# Patient Record
Sex: Female | Born: 1945 | Race: White | Hispanic: No | Smoking: Never smoker
Health system: Southern US, Community
[De-identification: ages and names within clinical notes are randomized; demographics above are authoritative.]

## PROBLEM LIST (undated history)

## (undated) DIAGNOSIS — I10 Essential (primary) hypertension: Secondary | ICD-10-CM

## (undated) DIAGNOSIS — E785 Hyperlipidemia, unspecified: Secondary | ICD-10-CM

## (undated) DIAGNOSIS — R011 Cardiac murmur, unspecified: Secondary | ICD-10-CM

## (undated) HISTORY — PX: OTHER SURGICAL HISTORY: SHX169

---

## 2001-07-30 ENCOUNTER — Emergency Department (HOSPITAL_COMMUNITY): Admission: EM | Admit: 2001-07-30 | Discharge: 2001-07-30 | Payer: Self-pay | Admitting: Emergency Medicine

## 2003-04-30 ENCOUNTER — Ambulatory Visit (HOSPITAL_COMMUNITY): Admission: RE | Admit: 2003-04-30 | Discharge: 2003-04-30 | Payer: Self-pay | Admitting: Internal Medicine

## 2012-07-07 HISTORY — PX: MELANOMA EXCISION: SHX5266

## 2013-03-31 ENCOUNTER — Other Ambulatory Visit (INDEPENDENT_AMBULATORY_CARE_PROVIDER_SITE_OTHER): Payer: Self-pay | Admitting: *Deleted

## 2013-03-31 ENCOUNTER — Telehealth (INDEPENDENT_AMBULATORY_CARE_PROVIDER_SITE_OTHER): Payer: Self-pay | Admitting: *Deleted

## 2013-03-31 DIAGNOSIS — Z1211 Encounter for screening for malignant neoplasm of colon: Secondary | ICD-10-CM

## 2013-03-31 MED ORDER — PEG-KCL-NACL-NASULF-NA ASC-C 100 G PO SOLR: 1.0000 | Freq: Once | ORAL | Status: DC

## 2013-03-31 NOTE — Telephone Encounter (Signed)
Patient needs movi prep 

## 2013-04-26 ENCOUNTER — Encounter (HOSPITAL_COMMUNITY): Payer: Self-pay | Admitting: Pharmacy Technician

## 2013-05-03 ENCOUNTER — Telehealth (INDEPENDENT_AMBULATORY_CARE_PROVIDER_SITE_OTHER): Payer: Self-pay | Admitting: *Deleted

## 2013-05-03 NOTE — Telephone Encounter (Signed)
agree

## 2013-05-03 NOTE — Telephone Encounter (Signed)
  Procedure: tcs  Reason/Indication:  screening  Has patient had this procedure before?  Yes, 10 yrs ago  If so, when, by whom and where?    Is there a family history of colon cancer?  no  Who?  What age when diagnosed?    Is patient diabetic?   no      Does patient have prosthetic heart valve?  no  Do you have a pacemaker?  no  Has patient ever had endocarditis? no  Has patient had joint replacement within last 12 months?  no  Does patient tend to be constipated or take laxatives? no  Is patient on Coumadin, Plavix and/or Aspirin? yes  Medications: asa 81 mg daily, stool softener bid, losartan 50 mg daily, crestor 40 mg daily, spironolactone 25 mg tid, calcium 600 plus d bid, medroxyprogesterone 25 mg every 3 months  Allergies: nkda  Medication Adjustment: asa 2 days  Procedure date & time: 05/25/13 at 1030

## 2013-05-25 ENCOUNTER — Ambulatory Visit (HOSPITAL_COMMUNITY)
Admission: RE | Admit: 2013-05-25 | Discharge: 2013-05-25 | Disposition: A | Discharge status: Home / Self Care | Payer: Medicare Other | Source: Ambulatory Visit | Attending: Internal Medicine | Admitting: Internal Medicine

## 2013-05-25 ENCOUNTER — Encounter (HOSPITAL_COMMUNITY)
Admission: RE | Disposition: A | Discharge status: Home / Self Care | Payer: Self-pay | Source: Ambulatory Visit | Attending: Internal Medicine

## 2013-05-25 ENCOUNTER — Encounter (HOSPITAL_COMMUNITY): Payer: Self-pay

## 2013-05-25 DIAGNOSIS — Z79899 Other long term (current) drug therapy: Secondary | ICD-10-CM | POA: Insufficient documentation

## 2013-05-25 DIAGNOSIS — K644 Residual hemorrhoidal skin tags: Secondary | ICD-10-CM | POA: Insufficient documentation

## 2013-05-25 DIAGNOSIS — Z1211 Encounter for screening for malignant neoplasm of colon: Secondary | ICD-10-CM

## 2013-05-25 DIAGNOSIS — Z6838 Body mass index (BMI) 38.0-38.9, adult: Secondary | ICD-10-CM | POA: Insufficient documentation

## 2013-05-25 DIAGNOSIS — I10 Essential (primary) hypertension: Secondary | ICD-10-CM | POA: Insufficient documentation

## 2013-05-25 DIAGNOSIS — D126 Benign neoplasm of colon, unspecified: Secondary | ICD-10-CM

## 2013-05-25 DIAGNOSIS — K5730 Diverticulosis of large intestine without perforation or abscess without bleeding: Secondary | ICD-10-CM

## 2013-05-25 DIAGNOSIS — Z7982 Long term (current) use of aspirin: Secondary | ICD-10-CM | POA: Insufficient documentation

## 2013-05-25 HISTORY — DX: Hyperlipidemia, unspecified: E78.5

## 2013-05-25 HISTORY — PX: COLONOSCOPY: SHX5424

## 2013-05-25 HISTORY — DX: Essential (primary) hypertension: I10

## 2013-05-25 HISTORY — DX: Cardiac murmur, unspecified: R01.1

## 2013-05-25 SURGERY — COLONOSCOPY
Anesthesia: Moderate Sedation

## 2013-05-25 MED ORDER — STERILE WATER FOR IRRIGATION IR SOLN
Status: DC | PRN
  Administered 2013-05-25: 10:00:00

## 2013-05-25 MED ORDER — MIDAZOLAM HCL 5 MG/5ML IJ SOLN
INTRAMUSCULAR | Status: AC
  Filled 2013-05-25: qty 10

## 2013-05-25 MED ORDER — SODIUM CHLORIDE 0.9 % IV SOLN
INTRAVENOUS | Status: DC
  Administered 2013-05-25: 10:00:00 via INTRAVENOUS

## 2013-05-25 MED ORDER — MIDAZOLAM HCL 5 MG/5ML IJ SOLN
INTRAMUSCULAR | Status: DC | PRN
  Administered 2013-05-25: 2 mg via INTRAVENOUS
  Administered 2013-05-25: 1 mg via INTRAVENOUS
  Administered 2013-05-25 (×2): 2 mg via INTRAVENOUS

## 2013-05-25 MED ORDER — MEPERIDINE HCL 50 MG/ML IJ SOLN
INTRAMUSCULAR | Status: DC | PRN
  Administered 2013-05-25 (×2): 25 mg via INTRAVENOUS

## 2013-05-25 MED ORDER — MEPERIDINE HCL 50 MG/ML IJ SOLN
INTRAMUSCULAR | Status: AC
  Filled 2013-05-25: qty 1

## 2013-05-25 NOTE — H&P (Signed)
Jennifer Cochran is an 68 y.o. female.   Chief Complaint: Patient is here for screening colonoscopy. HPI: Patient is a 67 year old Caucasian female who presents for screening colonoscopy. She denies abdominal pain change in bowel habits or rectal bleeding. Last colonoscopy was about 10 years ago.  Family history is negative for CRC.  Past Medical History  Diagnosis Date  . Hypertension     Past Surgical History  Procedure Laterality Date  . Melanoma excision  may 2014    [place] Dr. [place] Cochran  . Tooth removal  late 70's    History reviewed. No pertinent family history. Social History:  reports that she has never smoked. She does not have any smokeless tobacco history on file. She reports that she does not drink alcohol or use illicit drugs.  Allergies: No Known Allergies  Medications Prior to Admission  Medication Sig Dispense Refill  . aspirin (ECOTRIN LOW STRENGTH) 81 MG EC tablet Take 81 mg by mouth daily. Swallow whole.      . Calcium Carb-Cholecalciferol (CALCIUM 600 + D PO) Take 1 tablet by mouth 2 (two) times daily.      Marland Kitchen docusate sodium (COLACE) 100 MG capsule Take 100 mg by mouth 2 (two) times daily.      . Eflornithine HCl (VANIQA) 13.9 % cream Apply 1 application topically 2 (two) times daily with a meal.      . losartan (COZAAR) 50 MG tablet Take 50 mg by mouth daily.      . medroxyPROGESTERone (PROVERA) 2.5 MG tablet Take 2.5 mg by mouth as directed. TAKES ONE TABLET FOR 7 DAYS EVERY 3 MONTHS      . peg 3350 powder (MOVIPREP) 100 G SOLR Take 1 kit (200 g total) by mouth once.  1 kit  0  . rosuvastatin (CRESTOR) 40 MG tablet Take 40 mg by mouth daily.      Marland Kitchen spironolactone (ALDACTONE) 25 MG tablet Take 25 mg by mouth 3 (three) times daily.        No results found for this or any previous visit (from the past 48 hour(s)). No results found.  ROS  Blood pressure 167/71, pulse 94, temperature 98.5 F (36.9 C), temperature source Oral, resp. rate 21, height 5' 7.5"  (1.715 m), weight 250 lb (113.399 kg), SpO2 98.00%. Physical Exam  Constitutional: She appears well-developed and well-nourished.  HENT:  Mouth/Throat: Oropharynx is clear and moist.  Eyes: Conjunctivae are normal. No scleral icterus.  Neck: No thyromegaly present.  Cardiovascular: Normal rate and regular rhythm.   Murmur (grade 2/6 systolic ejection murmur best heard LLSB and LUSB.) heard. Respiratory: Effort normal and breath sounds normal.  GI: Soft. She exhibits no distension and no mass. There is no tenderness.  Musculoskeletal: She exhibits no edema.  Lymphadenopathy:    She has no cervical adenopathy.  Neurological: She is alert.  Skin: Skin is warm and dry.     Assessment/Plan Average risk screening colonoscopy.  Jennifer Cochran U 05/25/2013, 10:32 AM

## 2013-05-25 NOTE — Op Note (Signed)
COLONOSCOPY PROCEDURE REPORT  PATIENT:  Jennifer Cochran  MR#:  [id number] Birthdate:  11/01/1945, 68 y.o., female Endoscopist:  Dr. Rogene Houston, MD Referred By:  Dr. Starr Sinclair. Everette Rank, MD  Procedure Date: 05/25/2013  Procedure:   Colonoscopy  Indications:  Patient is a 68 year old Caucasian female was undergoing average risk screening colonoscopy.  Informed Consent:  The procedure and risks were reviewed with the patient and informed consent was obtained.  Medications:  Demerol 50 mg IV Versed 7 mg IV  Description of procedure:  After a digital rectal exam was performed, that colonoscope was advanced from the anus through the rectum and colon to the area of the cecum, ileocecal valve and appendiceal orifice. The cecum was deeply intubated. These structures were well-seen and photographed for the record. From the level of the cecum and ileocecal valve, the scope was slowly and cautiously withdrawn. The mucosal surfaces were carefully surveyed utilizing scope tip to flexion to facilitate fold flattening as needed. The scope was pulled down into the rectum where a thorough exam including retroflexion was performed.  Findings:   Prep excellent. Small polyp ablated via cold biopsy from ascending colon. Few small scattered diverticula at sigmoid colon. Normal rectal mucosa. Small hemorrhoids below the dentate line.   Therapeutic/Diagnostic Maneuvers Performed:  See above  Complications:  None  Cecal Withdrawal Time:  10 minutes  Impression:  Examination performed to cecum. Small polyp ablated via cold biopsy from ascending colon. Mild sigmoid colon diverticulosis. Small external hemorrhoids.  Recommendations:  Standard instructions given. I will contact patient with biopsy results and further recommendations.  Raidon Swanner U  05/25/2013 11:13 AM  CC: Dr. Lanette Hampshire, MD & Dr. Rayne Du ref. provider found

## 2013-05-25 NOTE — Discharge Instructions (Signed)
Resume usual medications and high fiber diet. No driving for 24 hours. Physician will call with biopsy results.  Colonoscopy, Care After Refer to this sheet in the next few weeks. These instructions provide you with information on caring for yourself after your procedure. Your health care provider may also give you more specific instructions. Your treatment has been planned according to current medical practices, but problems sometimes occur. Call your health care provider if you have any problems or questions after your procedure. WHAT TO EXPECT AFTER THE PROCEDURE  After your procedure, it is typical to have the following:  A small amount of blood in your stool.  Moderate amounts of gas and mild abdominal cramping or bloating. HOME CARE INSTRUCTIONS  Do not drive, operate machinery, or sign important documents for 24 hours.  You may shower and resume your regular physical activities, but move at a slower pace for the first 24 hours.  Take frequent rest periods for the first 24 hours.  Walk around or put a warm pack on your abdomen to help reduce abdominal cramping and bloating.  Drink enough fluids to keep your urine clear or pale yellow.  You may resume your normal diet as instructed by your health care provider. Avoid heavy or fried foods that are hard to digest.  Avoid drinking alcohol for 24 hours or as instructed by your health care provider.  Only take over-the-counter or prescription medicines as directed by your health care provider.  If a tissue sample (biopsy) was taken during your procedure:  Do not take aspirin or blood thinners for 7 days, or as instructed by your health care provider.  Do not drink alcohol for 7 days, or as instructed by your health care provider.  Eat soft foods for the first 24 hours. SEEK MEDICAL CARE IF: You have persistent spotting of blood in your stool 2 3 days after the procedure. SEEK IMMEDIATE MEDICAL CARE IF:  You have more than a  small spotting of blood in your stool.  You pass large blood clots in your stool.  Your abdomen is swollen (distended).  You have nausea or vomiting.  You have a fever.  You have increasing abdominal pain that is not relieved with medicine. Document Released: 10/08/2003 Document Revised: 12/14/2012 Document Reviewed: 10/31/2012 [facility] Patient Information 2014 [place].

## 2013-05-29 ENCOUNTER — Encounter (HOSPITAL_COMMUNITY): Payer: Self-pay | Admitting: Internal Medicine

## 2013-06-07 ENCOUNTER — Encounter (INDEPENDENT_AMBULATORY_CARE_PROVIDER_SITE_OTHER): Payer: Self-pay | Admitting: *Deleted

## 2013-06-30 ENCOUNTER — Ambulatory Visit (HOSPITAL_COMMUNITY)
Admission: RE | Admit: 2013-06-30 | Discharge: 2013-06-30 | Disposition: A | Discharge status: Home / Self Care | Payer: Medicare Other | Source: Ambulatory Visit | Attending: Family Medicine | Admitting: Family Medicine

## 2013-06-30 DIAGNOSIS — E785 Hyperlipidemia, unspecified: Secondary | ICD-10-CM | POA: Diagnosis not present

## 2013-06-30 DIAGNOSIS — R011 Cardiac murmur, unspecified: Secondary | ICD-10-CM | POA: Diagnosis not present

## 2013-06-30 DIAGNOSIS — I10 Essential (primary) hypertension: Secondary | ICD-10-CM | POA: Insufficient documentation

## 2013-06-30 DIAGNOSIS — I359 Nonrheumatic aortic valve disorder, unspecified: Secondary | ICD-10-CM

## 2013-06-30 NOTE — Progress Notes (Signed)
*  PRELIMINARY RESULTS* Echocardiogram 2D Echocardiogram has been performed.  Jennifer Cochran 06/30/2013, 9:13 AM

## 2014-07-23 ENCOUNTER — Ambulatory Visit (HOSPITAL_COMMUNITY)
Admission: RE | Admit: 2014-07-23 | Discharge: 2014-07-23 | Disposition: A | Discharge status: Home / Self Care | Payer: Medicare Other | Source: Ambulatory Visit | Attending: Family Medicine | Admitting: Family Medicine

## 2014-07-23 ENCOUNTER — Other Ambulatory Visit (HOSPITAL_COMMUNITY): Payer: Self-pay | Admitting: Family Medicine

## 2014-07-23 DIAGNOSIS — Z7722 Contact with and (suspected) exposure to environmental tobacco smoke (acute) (chronic): Secondary | ICD-10-CM

## 2016-05-19 ENCOUNTER — Other Ambulatory Visit (HOSPITAL_COMMUNITY): Payer: Self-pay | Admitting: Internal Medicine

## 2016-05-19 ENCOUNTER — Ambulatory Visit (HOSPITAL_COMMUNITY)
Admission: RE | Admit: 2016-05-19 | Discharge: 2016-05-19 | Disposition: A | Discharge status: Home / Self Care | Payer: Medicare Other | Source: Ambulatory Visit | Attending: Internal Medicine | Admitting: Internal Medicine

## 2016-05-19 DIAGNOSIS — M19071 Primary osteoarthritis, right ankle and foot: Secondary | ICD-10-CM | POA: Insufficient documentation

## 2016-05-19 DIAGNOSIS — M79671 Pain in right foot: Secondary | ICD-10-CM

## 2016-05-19 DIAGNOSIS — M7731 Calcaneal spur, right foot: Secondary | ICD-10-CM | POA: Insufficient documentation

## 2016-05-19 DIAGNOSIS — M25571 Pain in right ankle and joints of right foot: Secondary | ICD-10-CM

## 2016-08-27 ENCOUNTER — Ambulatory Visit (HOSPITAL_COMMUNITY)
Admission: RE | Admit: 2016-08-27 | Discharge: 2016-08-27 | Disposition: A | Discharge status: Home / Self Care | Payer: Medicare Other | Source: Ambulatory Visit | Attending: Internal Medicine | Admitting: Internal Medicine

## 2016-08-27 ENCOUNTER — Other Ambulatory Visit (HOSPITAL_COMMUNITY): Payer: Self-pay | Admitting: Internal Medicine

## 2016-08-27 DIAGNOSIS — I340 Nonrheumatic mitral (valve) insufficiency: Secondary | ICD-10-CM

## 2016-08-27 DIAGNOSIS — I351 Nonrheumatic aortic (valve) insufficiency: Secondary | ICD-10-CM | POA: Diagnosis not present

## 2016-08-27 LAB — ECHOCARDIOGRAM COMPLETE
AV Area VTI index: 0.53 cm2/m2
AV Area VTI: 1.01 cm2
AV Area mean vel: 1.07 cm2
AV Mean grad: 22 mmHg
AV Peak grad: 38 mmHg
AV peak Index: 0.43
AV pk vel: 310 cm/s
AV vel: 1.26
AVAREAMEANVIN: 0.45 cm2/m2
AVCELMEANRAT: 0.42
Ao pk vel: 0.4 m/s
CHL CUP AV VALUE AREA INDEX: 0.53
CHL CUP MV DEC (S): 264
CHL CUP STROKE VOLUME: 39 mL
CHL CUP TV REG PEAK VELOCITY: 203 cm/s
DOP CAL AO MEAN VELOCITY: 214 cm/s
E decel time: 264 msec
E/e' ratio: 13.17
FS: 35 % (ref 28–44)
IVS/LV PW RATIO, ED: 1.09
LA ID, A-P, ES: 32 mm
LA diam end sys: 32 mm
LA diam index: 1.35 cm/m2
LA vol A4C: 56.3 ml
LA vol index: 20.4 mL/m2
LA vol: 48.4 mL
LV E/e' medial: 13.17
LV E/e'average: 13.17
LV PW d: 12 mm — AB (ref 0.6–1.1)
LV SIMPSON'S DISK: 72
LV dias vol index: 23 mL/m2
LV e' LATERAL: 8.05 cm/s
LV sys vol index: 6 mL/m2
LV sys vol: 15 mL (ref 14–42)
LVDIAVOL: 55 mL (ref 46–106)
LVOT VTI: 36.9 cm
LVOT area: 2.54 cm2
LVOT peak grad rest: 6 mmHg
LVOTD: 18 mm
LVOTPV: 123 cm/s
LVOTSV: 94 mL
LVOTVTI: 0.5 cm
MV Peak grad: 4 mmHg
MVPKAVEL: 107 m/s
MVPKEVEL: 106 m/s
RV LATERAL S' VELOCITY: 14.1 cm/s
RV sys press: 19 mmHg
TAPSE: 24.4 mm
TDI e' lateral: 8.05
TDI e' medial: 5.77
TRMAXVEL: 203 cm/s
VTI: 74.2 cm
Valve area: 1.26 cm2

## 2016-08-27 NOTE — Progress Notes (Signed)
*  PRELIMINARY RESULTS* Echocardiogram 2D Echocardiogram has been performed.  Jennifer Cochran 08/27/2016, 11:39 AM

## 2017-08-28 IMAGING — DX DG FOOT COMPLETE 3+V*R*
3 series · 3 of 3 positions shown · non-contrast
Comparison: None.

CLINICAL DATA: Pain following twisting injury

EXAM:
RIGHT FOOT COMPLETE - 3+ VIEW

[foot ap]
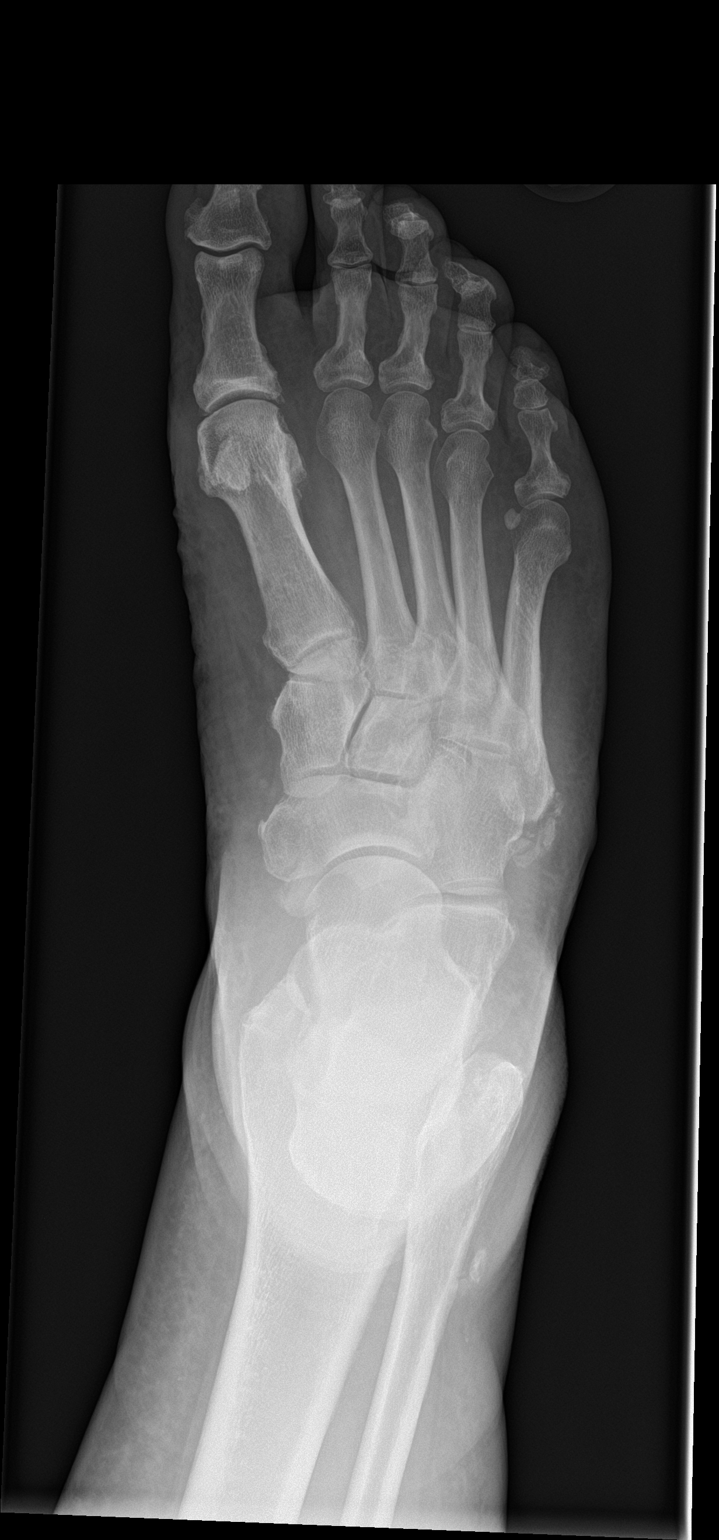

[foot obl]
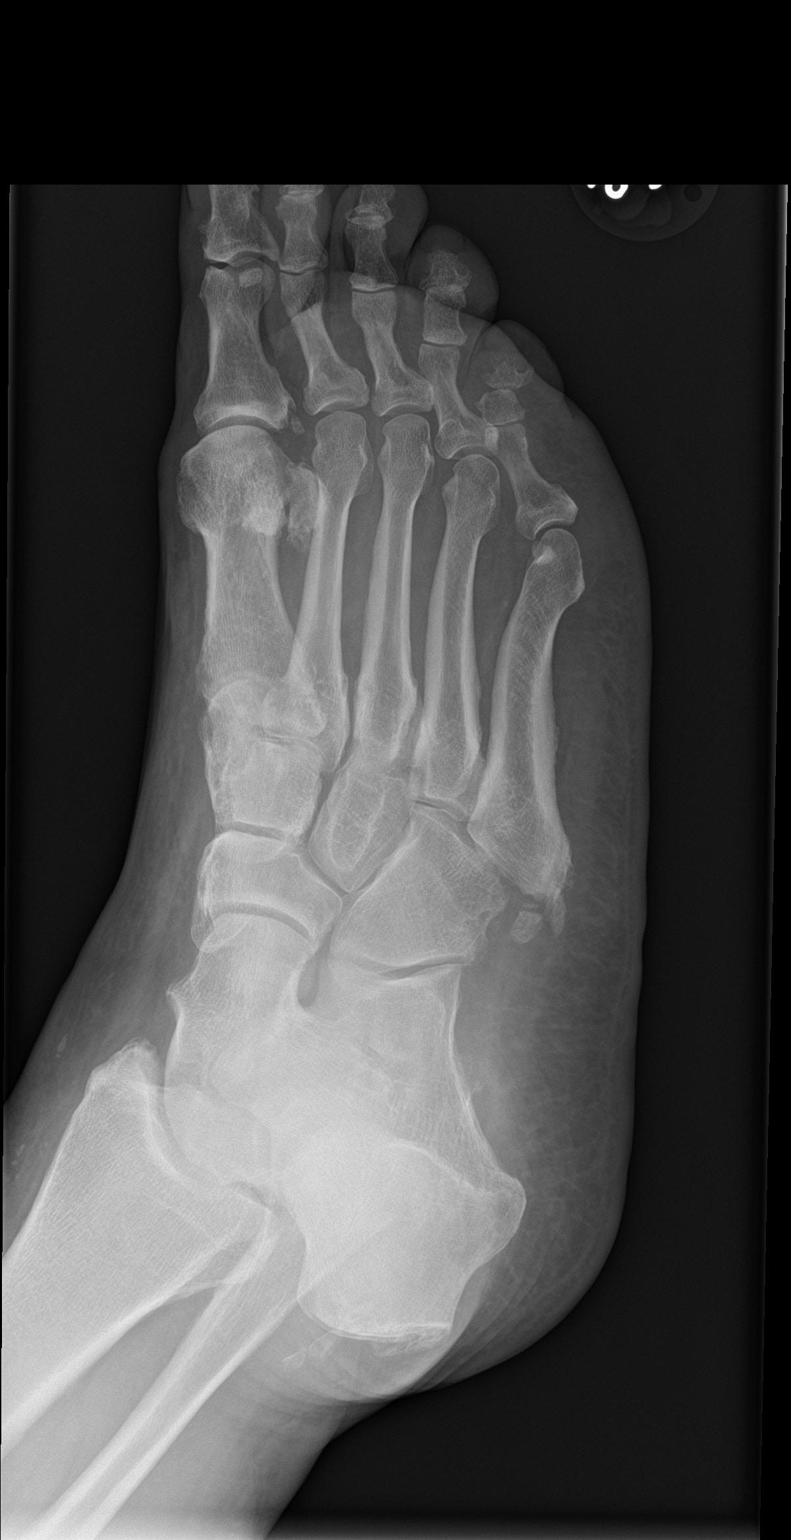

[foot lat]
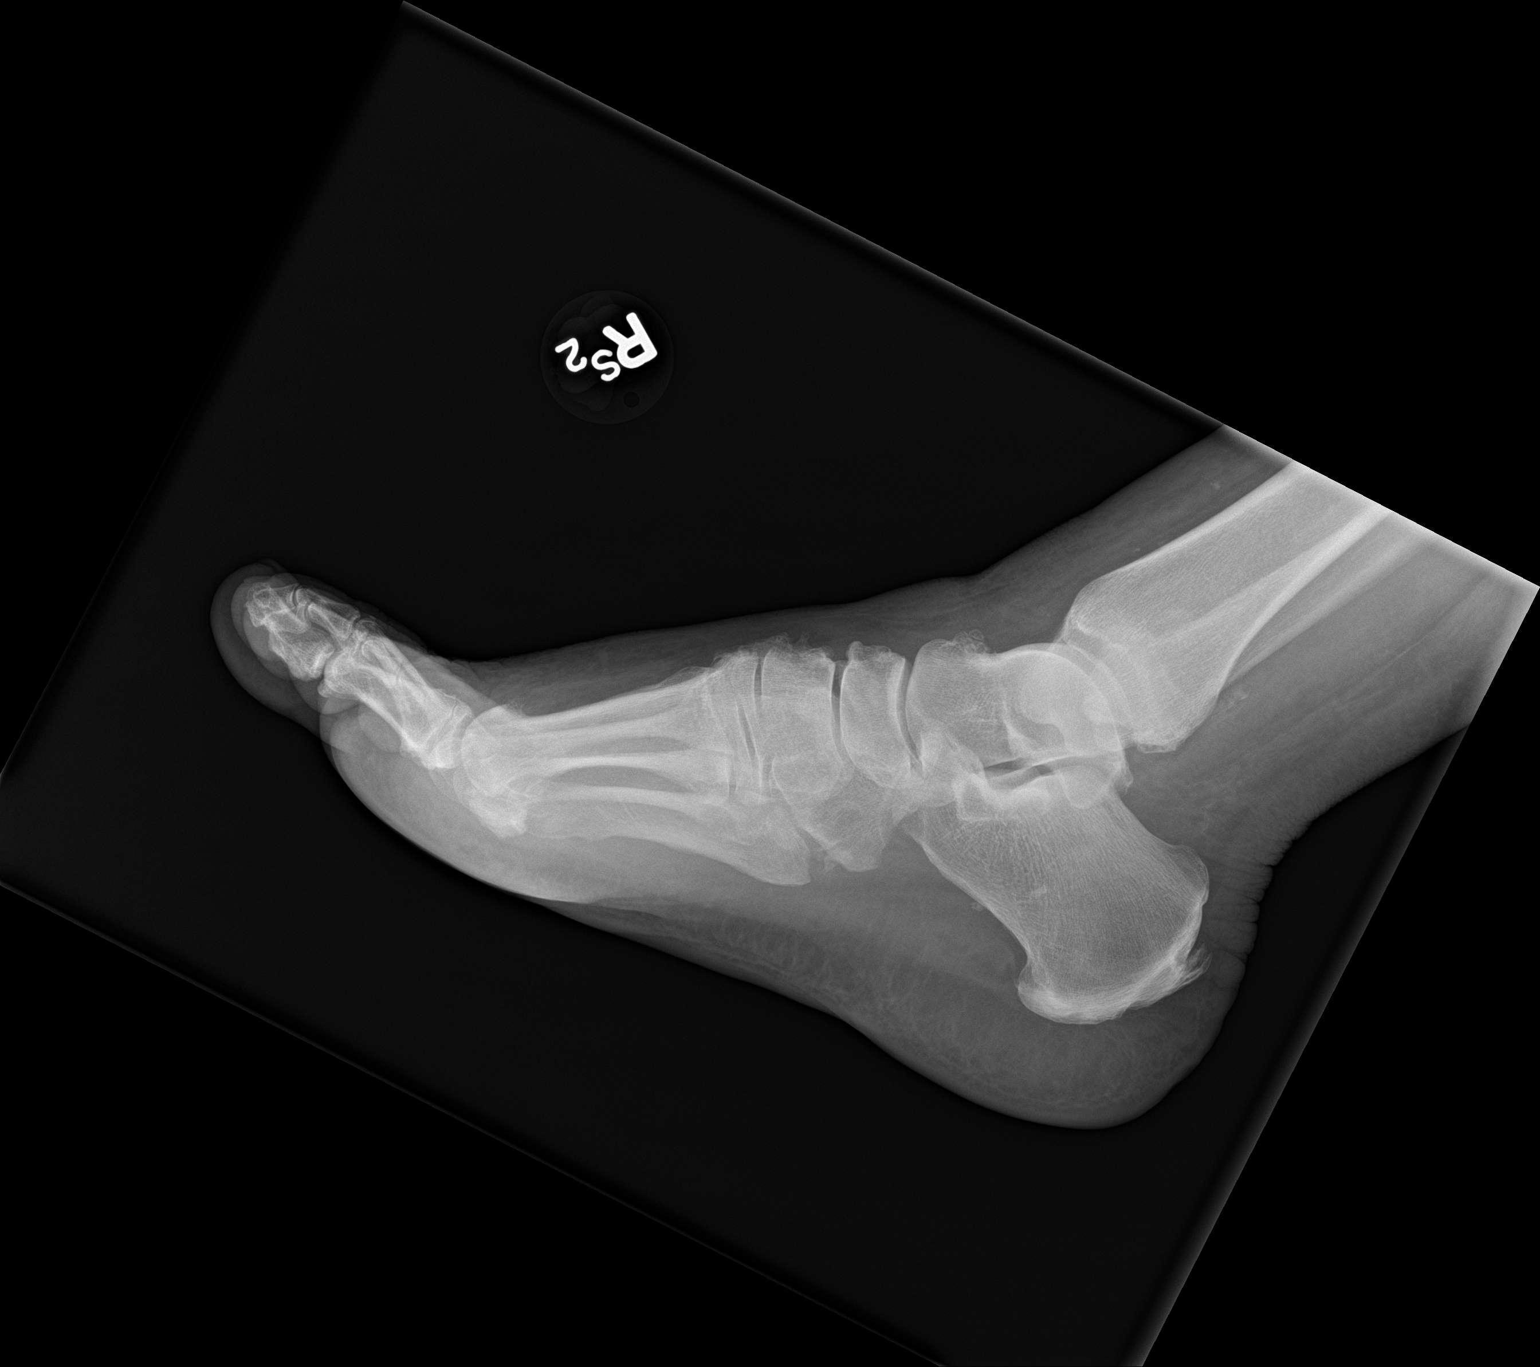

[3 of 3 positions shown; findings below may reference images not displayed]

FINDINGS: Frontal, oblique, and lateral views were obtained. There is no acute
fracture or dislocation. There is osteoarthritic change in the first
MTP joint as well as in all DIP joints. There is spurring throughout
the dorsal midfoot. There is a spur arising from posterior
calcaneus. No erosive change.
IMPRESSION: Multifocal osteoarthritic change. Posterior calcaneal spur noted. No
fracture or dislocation.

## 2018-06-14 ENCOUNTER — Other Ambulatory Visit: Payer: Self-pay

## 2018-06-14 ENCOUNTER — Telehealth: Payer: Self-pay | Admitting: *Deleted

## 2018-06-14 NOTE — Telephone Encounter (Signed)
Pt verbalized consent for telehealth appt with Dr Harl Bowie via [place] [place]. Pt will have BP/weight/HR available. Reviewed pt meds/allergies/pharmacy.

## 2018-06-15 ENCOUNTER — Encounter: Payer: Self-pay | Admitting: Cardiology

## 2018-06-15 ENCOUNTER — Telehealth (INDEPENDENT_AMBULATORY_CARE_PROVIDER_SITE_OTHER): Payer: Medicare Other | Admitting: Cardiology

## 2018-06-15 VITALS — BP 121/71 | HR 80 | Ht 67.5 in | Wt 258.0 lb

## 2018-06-15 DIAGNOSIS — I350 Nonrheumatic aortic (valve) stenosis: Secondary | ICD-10-CM

## 2018-06-15 DIAGNOSIS — I10 Essential (primary) hypertension: Secondary | ICD-10-CM

## 2018-06-15 DIAGNOSIS — R011 Cardiac murmur, unspecified: Secondary | ICD-10-CM

## 2018-06-15 NOTE — Patient Instructions (Signed)
Your physician wants you to follow-up in: [place] will receive a reminder letter in the mail two months in advance. If you don't receive a letter, please call our office to schedule the follow-up appointment.  Your physician recommends that you continue on your current medications as directed. Please refer to the Current Medication list given to you today.  Your physician has requested that you have an echocardiogram. Echocardiography is a painless test that uses sound waves to create images of your heart. It provides your doctor with information about the size and shape of your heart and how well your heart's chambers and valves are working. This procedure takes approximately one hour. There are no restrictions for this procedure.  Your physician has requested that you regularly monitor and record your blood pressure readings at home [address] [place]. Please use the same machine at the same time of day to check your readings and record them to bring to your follow-up visit.  Thank you for choosing [facility]!!

## 2018-06-15 NOTE — Progress Notes (Signed)
Virtual Visit via Telephone Note   This visit type was conducted due to national recommendations for restrictions regarding the COVID-19 Pandemic (e.g. social distancing) in an effort to limit this patient's exposure and mitigate transmission in our community.  Due to her co-morbid illnesses, this patient is at least at moderate risk for complications without adequate follow up.  This format is felt to be most appropriate for this patient at this time.  The patient did not have access to video technology/had technical difficulties with video requiring transitioning to audio format only (telephone).  All issues noted in this document were discussed and addressed.  No physical exam could be performed with this format.  Please refer to the patient's chart for her  consent to telehealth for [facility].   Evaluation Performed:  Follow-up visit  Date:  06/15/2018   ID:  Jennifer Cochran, DOB 12/22/1945, MRN [id number]  Patient Location: Home  Provider Location: Office  PCP:  Denyce Robert, FNP  Cardiologist:  Dr Carlyle Dolly MD Electrophysiologist:  None   Chief Complaint:  Heart murmur  History of Present Illness:    RENIAH Cochran is a 73 y.o. female who presents via audio/video conferencing for a telehealth visit today.    Seen as new patient for the following medical problems.  1. Heart murmur/Aortic stenosis - she reports a very long history of heart murmur - 08/2016 echo LVEF 65-70%, moderate AS by mean grad of 21 and AVA VTI 1.26  - no recent chest pain, SOB/DOE. Sedentary lifestyle.   2. HTN - she is on losartan Home bp's 140s/80s on average. Today 121/71 -    3. Hyperlipidemia - has been on crestor  4. Carotid bruit - as reported on pcp note -denies any symptoms.   5. Polycystic ovaries - on aldactone    The patient does not have symptoms concerning for COVID-19 infection (fever, chills, cough, or new shortness of breath).    Past Medical  History:  Diagnosis Date  . Heart murmur   . Hyperlipidemia   . Hypertension    Past Surgical History:  Procedure Laterality Date  . COLONOSCOPY N/A 05/25/2013   Procedure: COLONOSCOPY;  Surgeon: Rogene Houston, MD;  [facility];  Service: Endoscopy;  Laterality: N/A;  1030  . MELANOMA EXCISION  may 2014   [place] Dr. [place] Crane  . tooth removal  late 70's     No outpatient medications have been marked as taking for the 06/15/18 encounter (Appointment) with Arnoldo Lenis, MD.     Allergies:   Patient has no known allergies.   Social History   Tobacco Use  . Smoking status: Never Smoker  Substance Use Topics  . Alcohol use: No  . Drug use: No     Family Hx: The patient's family history is not on file.  ROS:   Please see the history of present illness.     All other systems reviewed and are negative.   Prior CV studies:   The following studies were reviewed today:  08/2016 echo Study Conclusions  - Left ventricle: The cavity size was normal. Wall thickness was   increased in a pattern of mild LVH. Systolic function was   vigorous. The estimated ejection fraction was in the range of 65%   to 70%. - Aortic valve: AV ismoderately thickened, calcified with mildly   restricted motion. Peak and mean gradients through the valve are   37 and 21 mm Hg respectively.  Labs/Other  Tests and Data Reviewed:    EKG:  na  Recent Labs: No results found for requested labs within last 8760 hours.   Recent Lipid Panel No results found for: CHOL, TRIG, HDL, CHOLHDL, LDLCALC, LDLDIRECT  Wt Readings from Last 3 Encounters:  05/25/13 250 lb (113.4 kg)     Objective:    Vital Signs:  There were no vitals taken for this visit.   Normal affect and speech patterns, sounds comfortable in no distress. No auditory sounds of heavy breathing or wheezing.   ASSESSMENT & PLAN:    1. Moderate aortic stenosis -asymptomatic, last echo in 2018 - plan for repeat study  once COVID-19 risks have decreased,  2. HTN - some elevated bp's, she will call us in 1 week to update [place] on her bp log  3. Carotid bruits - per pcp note, we will wait until able to exam at our f/u to decide if needs [place].     COVID-19 Education: The signs and symptoms of COVID-19 were discussed with the patient and how to seek care for testing (follow up with PCP or arrange E-visit).  The importance of social distancing was discussed today.  Time:   Today, I have spent 30 minutes with the patient with telehealth technology discussing the above problems.     Medication Adjustments/Labs and Tests Ordered: Current medicines are reviewed at length with the patient today.  Concerns regarding medicines are outlined above.  Tests Ordered: No orders of the defined types were placed in this encounter.  Medication Changes: No orders of the defined types were placed in this encounter.   Disposition:  Follow up 6 months Signed, Carlyle Dolly, MD  06/15/2018 9:22 AM    [facility]

## 2018-09-28 ENCOUNTER — Ambulatory Visit (INDEPENDENT_AMBULATORY_CARE_PROVIDER_SITE_OTHER): Payer: Medicare Other

## 2018-09-28 ENCOUNTER — Other Ambulatory Visit: Payer: Self-pay

## 2018-09-28 DIAGNOSIS — R011 Cardiac murmur, unspecified: Secondary | ICD-10-CM | POA: Diagnosis not present

## 2018-10-05 ENCOUNTER — Telehealth: Payer: Self-pay | Admitting: *Deleted

## 2018-10-05 NOTE — Telephone Encounter (Signed)
-----   Message from Arnoldo Lenis, MD sent at 10/04/2018 10:01 AM EDT ----- Echo shows that her aortic valve has become more stiff since her study in 2018, and is in the moderate to severe range. In the absence of any significant SOB or chest pain this is just something that we continue to monitor. Please verify no new symptoms since our last visit. There likely will be at time within the next few years we will need to consider a valve replacement. Keep our original f/u  Zandra Abts MD

## 2018-10-05 NOTE — Telephone Encounter (Signed)
Pt voiced understanding - wanted to make Dr Harl Bowie aware that she has been checking BP at home since April and has been 120s/60s-70s - has f/u scheduled for October - updated mediation list as well to add vitamins

## 2018-12-23 ENCOUNTER — Encounter: Payer: Self-pay | Admitting: Cardiology

## 2018-12-23 ENCOUNTER — Ambulatory Visit (INDEPENDENT_AMBULATORY_CARE_PROVIDER_SITE_OTHER): Payer: Medicare Other | Admitting: Cardiology

## 2018-12-23 ENCOUNTER — Other Ambulatory Visit: Payer: Self-pay

## 2018-12-23 VITALS — BP 152/70 | HR 89 | Ht 67.5 in | Wt 265.0 lb

## 2018-12-23 DIAGNOSIS — R0989 Other specified symptoms and signs involving the circulatory and respiratory systems: Secondary | ICD-10-CM | POA: Diagnosis not present

## 2018-12-23 DIAGNOSIS — I10 Essential (primary) hypertension: Secondary | ICD-10-CM | POA: Diagnosis not present

## 2018-12-23 DIAGNOSIS — R011 Cardiac murmur, unspecified: Secondary | ICD-10-CM

## 2018-12-23 DIAGNOSIS — I350 Nonrheumatic aortic (valve) stenosis: Secondary | ICD-10-CM

## 2018-12-23 NOTE — Patient Instructions (Addendum)
Your physician recommends that you schedule a follow-up appointment in: LATE January WITH DR [facility]  Your physician recommends that you continue on your current medications as directed. Please refer to the Current Medication list given to you today.  Your physician has requested that you have an echocardiogram. IN EARLY January 2021 Echocardiography is a painless test that uses sound waves to create images of your heart. It provides your doctor with information about the size and shape of your heart and how well your heart's chambers and valves are working. This procedure takes approximately one hour. There are no restrictions for this procedure.  Your physician has requested that you have a carotid duplex. This test is an ultrasound of the carotid arteries in your neck. It looks at blood flow through these arteries that supply the brain with blood. Allow one hour for this exam. There are no restrictions or special instructions.  Thank you for choosing [facility]!!

## 2018-12-23 NOTE — Progress Notes (Signed)
Clinical Summary Ms. Gonser is a 73 y.o.female seen today for follow up of the following medical problems.   1. Heart murmur/Aortic stenosis - she reports a very long history of heart murmur - 08/2016 echo LVEF 65-70%, moderate AS by mean grad of 21 and AVA VTI 1.26  09/2018 echo LVEF >65%, mean grad 44 mmHg, AVA VTI 0.95, dimensionless index 0.38  - no recent SOB/DOE, no chest pain, no syncope - sedentary lifestyle, but tolerates housework and yardwork without troubles -does a stationary foot pedal x 1 mile a day without troubles.   2. HTN - home bp's 110s-130s/70s    3. Hyperlipidemia - compliant withs tatin  4. Carotid bruit - as reported on pcp note -has not had carotid US - denies any neuro symptoms  5. Polycystic ovaries - on aldactone   SH: sister with parkinsons disease, just started hospice care.       Past Medical History:  Diagnosis Date  . Heart murmur   . Hyperlipidemia   . Hypertension      No Known Allergies   Current Outpatient Medications  Medication Sig Dispense Refill  . aspirin (ECOTRIN LOW STRENGTH) 81 MG EC tablet Take 81 mg by mouth daily. Swallow whole.    . Calcium Carb-Cholecalciferol (CALCIUM 600 + D PO) Take 1 tablet by mouth 2 (two) times daily.    . Cholecalciferol (VITAMIN D3) 50 MCG (2000 UT) TABS Take by mouth.    . docusate sodium (COLACE) 100 MG capsule Take 100 mg by mouth 2 (two) times daily.    . Dulaglutide (TRULICITY) A999333 0000000 SOPN Inject into the skin.    Marland Kitchen losartan (COZAAR) 50 MG tablet Take 50 mg by mouth daily.    . medroxyPROGESTERone (PROVERA) 2.5 MG tablet Take 2.5 mg by mouth as directed. TAKES ONE TABLET FOR 7 DAYS EVERY 3 MONTHS    . omeprazole (PRILOSEC) 20 MG capsule Take 20 mg by mouth daily.    . rosuvastatin (CRESTOR) 40 MG tablet Take 40 mg by mouth daily.    Marland Kitchen spironolactone (ALDACTONE) 25 MG tablet Take 25 mg by mouth 3 (three) times daily.    . Zinc Sulfate (ZINC 15 PO) Take by  mouth.     No current facility-administered medications for this visit.      Past Surgical History:  Procedure Laterality Date  . COLONOSCOPY N/A 05/25/2013   Procedure: COLONOSCOPY;  Surgeon: Rogene Houston, MD;  [facility];  Service: Endoscopy;  Laterality: N/A;  1030  . MELANOMA EXCISION  may 2014   [place] Dr. [place] Crane  . tooth removal  late 70's     No Known Allergies    No family history on file.   Social History Ms. Blew reports that she has never smoked. She has never used smokeless tobacco. Ms. Stricklen reports no history of alcohol use.   Review of Systems CONSTITUTIONAL: No weight loss, fever, chills, weakness or fatigue.  HEENT: Eyes: No visual loss, blurred vision, double vision or yellow sclerae.No hearing loss, sneezing, congestion, runny nose or sore throat.  SKIN: No rash or itching.  CARDIOVASCULAR: per hpi RESPIRATORY: No shortness of breath, cough or sputum.  GASTROINTESTINAL: No anorexia, nausea, vomiting or diarrhea. No abdominal pain or blood.  GENITOURINARY: No burning on urination, no polyuria NEUROLOGICAL: No headache, dizziness, syncope, paralysis, ataxia, numbness or tingling in the extremities. No change in bowel or bladder control.  MUSCULOSKELETAL: No muscle, back pain, joint pain or stiffness.  LYMPHATICS: No enlarged nodes. No history of splenectomy.  PSYCHIATRIC: No history of depression or anxiety.  ENDOCRINOLOGIC: No reports of sweating, cold or heat intolerance. No polyuria or polydipsia.  Marland Kitchen   Physical Examination Today's Vitals   12/23/18 1128  BP: (!) 152/70  Pulse: 89  SpO2: 97%  Weight: 265 lb (120.2 kg)  Height: 5' 7.5" (1.715 m)   Body mass index is 40.89 kg/m.  Gen: resting comfortably, no acute distress HEENT: no scleral icterus, pupils equal round and reactive, no palptable cervical adenopathy,  CV: RRR, 3/6 systolic murmur rusb, bilateral carotid bruits Resp: Clear to auscultation  bilaterally GI: abdomen is soft, non-tender, non-distended, normal bowel sounds, no hepatosplenomegaly MSK: extremities are warm, no edema.  Skin: warm, no rash Neuro:  no focal deficits Psych: appropriate affect   Diagnostic Studies  08/2016 echo Study Conclusions  - Left ventricle: The cavity size was normal. Wall thickness was increased in a pattern of mild LVH. Systolic function was vigorous. The estimated ejection fraction was in the range of 65% to 70%. - Aortic valve: AV ismoderately thickened, calcified with mildly restricted motion. Peak and mean gradients through the valve are 37 and 21 mm Hg respectively.   Assessment and Plan  1. Aortic stenosis -recent US most consistent with severe aortic stenosis. She denies any symptoms, no signs of LV dysfunction - continue to monitor at this time, repeat echo early Jan 2021    2. HTN - home numbers at goal though elevated in clinic, continue current meds    3. Carotid bruits - order carotid US   EKG shows SR, no ischemic changes.   F/u 6 months     Arnoldo Lenis, M.D.

## 2019-01-19 ENCOUNTER — Ambulatory Visit (INDEPENDENT_AMBULATORY_CARE_PROVIDER_SITE_OTHER): Payer: Medicare Other

## 2019-01-19 ENCOUNTER — Other Ambulatory Visit: Payer: Self-pay

## 2019-01-19 DIAGNOSIS — R0989 Other specified symptoms and signs involving the circulatory and respiratory systems: Secondary | ICD-10-CM | POA: Diagnosis not present

## 2019-01-31 ENCOUNTER — Telehealth: Payer: Self-pay | Admitting: *Deleted

## 2019-01-31 NOTE — Telephone Encounter (Signed)
Pt voiced understanding - routed to pcp  

## 2019-01-31 NOTE — Telephone Encounter (Signed)
LM to return call.

## 2019-01-31 NOTE — Telephone Encounter (Signed)
-----   Message from Arnoldo Lenis, MD sent at 01/30/2019  8:21 AM EST ----- Carotid US shows mild to moderate blockages, we will conitnue to monitor at this time   Zandra Abts MD

## 2019-03-16 ENCOUNTER — Other Ambulatory Visit (HOSPITAL_COMMUNITY): Payer: Self-pay | Admitting: Cardiology

## 2019-03-16 DIAGNOSIS — I6523 Occlusion and stenosis of bilateral carotid arteries: Secondary | ICD-10-CM

## 2019-03-22 ENCOUNTER — Ambulatory Visit (INDEPENDENT_AMBULATORY_CARE_PROVIDER_SITE_OTHER): Payer: Medicare PPO

## 2019-03-22 ENCOUNTER — Other Ambulatory Visit: Payer: Self-pay

## 2019-03-22 DIAGNOSIS — I350 Nonrheumatic aortic (valve) stenosis: Secondary | ICD-10-CM | POA: Diagnosis not present

## 2019-04-03 ENCOUNTER — Telehealth: Payer: Self-pay | Admitting: *Deleted

## 2019-04-03 NOTE — Telephone Encounter (Signed)
Patient informed. Copy sent to PCP °

## 2019-04-03 NOTE — Telephone Encounter (Signed)
-----   Message from Arnoldo Lenis, MD sent at 04/03/2019 12:00 PM EST ----- Echo shows heart function remains strong, aortic valve remains moderately to severely thickened (aka stenosis), we will continue to monitor  Jennifer Abts MD

## 2019-04-04 ENCOUNTER — Telehealth (INDEPENDENT_AMBULATORY_CARE_PROVIDER_SITE_OTHER): Payer: Medicare PPO | Admitting: Cardiology

## 2019-04-04 ENCOUNTER — Encounter: Payer: Self-pay | Admitting: *Deleted

## 2019-04-04 ENCOUNTER — Encounter: Payer: Self-pay | Admitting: Cardiology

## 2019-04-04 VITALS — BP 122/63 | HR 71 | Ht 67.5 in | Wt 264.0 lb

## 2019-04-04 DIAGNOSIS — E782 Mixed hyperlipidemia: Secondary | ICD-10-CM

## 2019-04-04 DIAGNOSIS — I350 Nonrheumatic aortic (valve) stenosis: Secondary | ICD-10-CM

## 2019-04-04 DIAGNOSIS — I6523 Occlusion and stenosis of bilateral carotid arteries: Secondary | ICD-10-CM

## 2019-04-04 DIAGNOSIS — I10 Essential (primary) hypertension: Secondary | ICD-10-CM

## 2019-04-04 NOTE — Patient Instructions (Signed)

## 2019-04-04 NOTE — Progress Notes (Signed)
Virtual Visit via Telephone Note   This visit type was conducted due to national recommendations for restrictions regarding the COVID-19 Pandemic (e.g. social distancing) in an effort to limit this patient's exposure and mitigate transmission in our community.  Due to her co-morbid illnesses, this patient is at least at moderate risk for complications without adequate follow up.  This format is felt to be most appropriate for this patient at this time.  The patient did not have access to video technology/had technical difficulties with video requiring transitioning to audio format only (telephone).  All issues noted in this document were discussed and addressed.  No physical exam could be performed with this format.  Please refer to the patient's chart for her  consent to telehealth for [facility].   Date:  04/04/2019   ID:  Jennifer Cochran, DOB 1945/10/18, MRN [id number]  Patient Location: Home Provider Location: Office  PCP:  Denyce Robert, FNP  Cardiologist:  Carlyle Dolly, MD  Electrophysiologist:  None   Evaluation Performed:  Follow-Up Visit  Chief Complaint:  Follow up visit  History of Present Illness:    Jennifer Cochran is a 74 y.o. female seen today for follow up of the following medical problems.   1. Aortic stenosis - she reports a very long history of heart murmur - 08/2016 echo LVEF 65-70%, moderate AS by mean grad of 21 and AVA VTI 1.26  09/2018 echo LVEF >65%, mean grad 44 mmHg, AVA VTI 0.95, dimensionless index 0.38  - no recent SOB/DOE, no chest pain, no syncope - sedentary lifestyle, but tolerates housework and yardwork without troubles -does a stationary foot pedal x 1 mile a day without troubles.   Jan 2021 echo LVEF 65-70%, mod to severe AS. Mean grad 37, AVA VTI 1.10, dimensionless index 0.43 - no recent symptoms   2. HTN - compliant with meds    3. Hyperlipidemia - on statin - labs followed by pcp  4. Carotid  stenosis - 01/2019 carotid US RICA [id number], LICA [id number] - no recent symptoms  5. Polycystic ovaries - on aldactone   SH: sister with parkinsons disease, just started hospice care.  The patient does not have symptoms concerning for COVID-19 infection (fever, chills, cough, or new shortness of breath).    Past Medical History:  Diagnosis Date  . Heart murmur   . Hyperlipidemia   . Hypertension    Past Surgical History:  Procedure Laterality Date  . COLONOSCOPY N/A 05/25/2013   Procedure: COLONOSCOPY;  Surgeon: Rogene Houston, MD;  Location: [facility];  Service: Endoscopy;  Laterality: N/A;  1030  . MELANOMA EXCISION  may 2014   [place] Dr. [place] Crane  . tooth removal  late 70's     No outpatient medications have been marked as taking for the 04/04/19 encounter (Appointment) with Arnoldo Lenis, MD.     Allergies:   Patient has no known allergies.   Social History   Tobacco Use  . Smoking status: Never Smoker  . Smokeless tobacco: Never Used  Substance Use Topics  . Alcohol use: No  . Drug use: No     Family Hx: The patient's family history is not on file.  ROS:   Please see the history of present illness.     All other systems reviewed and are negative.   Prior CV studies:   The following studies were reviewed today:  08/2016 echo Study Conclusions  - Left ventricle: The cavity size was normal. Wall thickness  was increased in a pattern of mild LVH. Systolic function was vigorous. The estimated ejection fraction was in the range of 65% to 70%. - Aortic valve: AV ismoderately thickened, calcified with mildly restricted motion. Peak and mean gradients through the valve are 37 and 21 mm Hg respectively.  Jan 2021 echo IMPRESSIONS    1. Left ventricular ejection fraction, by visual estimation, is 65 to 70%. The left ventricle has hyperdynamic function. There is mildly increased left ventricular hypertrophy.  2. Elevated left ventricular  end-diastolic pressure.  3. The left ventricle has no regional wall motion abnormalities.  4. Diastolic dysfunction, grade indeterminate.  5. Global right ventricle has normal systolic function.The right ventricular size is normal. No increase in right ventricular wall thickness.  6. Left atrial size was normal.  7. Right atrial size was normal.  8. Mild mitral annular calcification.  9. The mitral valve is grossly normal. Trivial mitral valve regurgitation. 10. The tricuspid valve is not well visualized. 11. The aortic valve was not well visualized. Aortic valve regurgitation is mild. Moderate to severe aortic valve stenosis. 12. Dimensionless index suggestive of moderate aortic valve stenosis but peak velocities are approaching 4 m/sec. 13. The pulmonic valve was not well visualized. Pulmonic valve regurgitation is not visualized. 14. Normal pulmonary artery systolic pressure. 15. The interatrial septum was not well visualized. Labs/Other Tests and Data Reviewed:    EKG:  No ECG reviewed.  Recent Labs: No results found for requested labs within last 8760 hours.   Recent Lipid Panel No results found for: CHOL, TRIG, HDL, CHOLHDL, LDLCALC, LDLDIRECT  Wt Readings from Last 3 Encounters:  12/23/18 265 lb (120.2 kg)  06/15/18 258 lb (117 kg)  05/25/13 250 lb (113.4 kg)     Objective:    Vital Signs:   Today's Vitals   04/04/19 1259  BP: 122/63  Pulse: 71  Weight: 264 lb (119.7 kg)  Height: 5' 7.5" (1.715 m)   Body mass index is 40.74 kg/m. Normal affect. Normal speech pattern and tone. Comfortable, no apparent distress. No audible signs of SOB or wheezing.   ASSESSMENT & PLAN:    1. Aortic stenosis -asymptomatic moderate to severe AS, continue to monitor at this time. Repeat echo next year  2. Carotid stenosis - asymptomatic mild to moderate stenosis, continue medical therapy and surveillance. Repeat US later this year  3. HTN - at goal,continue current meds  4.  Hyperlipidemia - continue staitn, request pcp labs.      COVID-19 Education: The signs and symptoms of COVID-19 were discussed with the patient and how to seek care for testing (follow up with PCP or arrange E-visit).  The importance of social distancing was discussed today.  Time:   Today, I have spent 20 minutes with the patient with telehealth technology discussing the above problems.     Medication Adjustments/Labs and Tests Ordered: Current medicines are reviewed at length with the patient today.  Concerns regarding medicines are outlined above.   Tests Ordered: No orders of the defined types were placed in this encounter.   Medication Changes: No orders of the defined types were placed in this encounter.   Follow Up:  Either In Person or Virtual in 6 month(s)  Signed, Carlyle Dolly, MD  04/04/2019 12:19 PM    [place]

## 2019-10-03 ENCOUNTER — Encounter: Payer: Self-pay | Admitting: Cardiology

## 2019-10-03 ENCOUNTER — Encounter: Payer: Self-pay | Admitting: *Deleted

## 2019-10-03 ENCOUNTER — Ambulatory Visit: Payer: Medicare PPO | Admitting: Cardiology

## 2019-10-03 VITALS — BP 157/75 | HR 77 | Ht 67.5 in | Wt 268.0 lb

## 2019-10-03 DIAGNOSIS — I10 Essential (primary) hypertension: Secondary | ICD-10-CM | POA: Diagnosis not present

## 2019-10-03 DIAGNOSIS — I6523 Occlusion and stenosis of bilateral carotid arteries: Secondary | ICD-10-CM | POA: Diagnosis not present

## 2019-10-03 DIAGNOSIS — I350 Nonrheumatic aortic (valve) stenosis: Secondary | ICD-10-CM

## 2019-10-03 DIAGNOSIS — E782 Mixed hyperlipidemia: Secondary | ICD-10-CM

## 2019-10-03 NOTE — Patient Instructions (Addendum)

## 2019-10-03 NOTE — Progress Notes (Signed)
Clinical Summary Jennifer Cochran is a 74 y.o.female seen today for follow up of the following medical problems.  1. Aortic stenosis - she reports a very long history of heart murmur - 08/2016 echo LVEF 65-70%, moderate AS by mean grad of 21 and AVA VTI 1.26  09/2018 echo LVEF >65%, mean grad 44 mmHg, AVA VTI 0.95, dimensionless index 0.38   Jan 2021 echo LVEF 65-70%, mod to severe AS. Mean grad 37, AVA VTI 1.10, dimensionless index 0.43 - no significant SOB or DOE. No chest pain, no presyncope/syncope   2. HTN - remains compliant with meds - homes bp's 120s/70s    3. Hyperlipidemia - compliantw with statin, labs followed by pcp  4. Carotid stenosis - 01/2019 carotid US RICA 0-27%, LICA 74-12% - no recent symptoms  5. Polycystic ovaries - on aldactone    SH: recent bus trip to [place] She has had covid vaccine  Past Medical History:  Diagnosis Date  . Heart murmur   . Hyperlipidemia   . Hypertension      No Known Allergies   Current Outpatient Medications  Medication Sig Dispense Refill  . aspirin (ECOTRIN LOW STRENGTH) 81 MG EC tablet Take 81 mg by mouth daily. Swallow whole.    . Calcium Carb-Cholecalciferol (CALCIUM 600 + D PO) Take 1 tablet by mouth 2 (two) times daily.    . Cholecalciferol (VITAMIN D3) 50 MCG (2000 UT) TABS Take by mouth.    . docusate sodium (COLACE) 100 MG capsule Take 100 mg by mouth 2 (two) times daily.    . Dulaglutide (TRULICITY) 8.78 MV/6.7MC SOPN Inject into the skin.    Marland Kitchen losartan (COZAAR) 50 MG tablet Take 50 mg by mouth daily.    . medroxyPROGESTERone (PROVERA) 2.5 MG tablet Take 2.5 mg by mouth as directed. TAKES ONE TABLET FOR 7 DAYS EVERY 3 MONTHS    . omeprazole (PRILOSEC) 20 MG capsule Take 20 mg by mouth daily.    . rosuvastatin (CRESTOR) 40 MG tablet Take 40 mg by mouth daily.    Marland Kitchen spironolactone (ALDACTONE) 25 MG tablet Take 25 mg by mouth 3 (three) times daily.    . Zinc Sulfate (ZINC 15 PO)  Take by mouth.     No current facility-administered medications for this visit.     Past Surgical History:  Procedure Laterality Date  . COLONOSCOPY N/A 05/25/2013   Procedure: COLONOSCOPY;  Surgeon: Rogene Houston, MD;  [facility];  Service: Endoscopy;  Laterality: N/A;  1030  . MELANOMA EXCISION  may 2014   [place] Dr. [place]  . tooth removal  late 70's     No Known Allergies    No family history on file.   Social History Jennifer Cochran reports that she has never smoked. She has never used smokeless tobacco. Jennifer Cochran reports no history of alcohol use.   Review of Systems CONSTITUTIONAL: No weight loss, fever, chills, weakness or fatigue.  HEENT: Eyes: No visual loss, blurred vision, double vision or yellow sclerae.No hearing loss, sneezing, congestion, runny nose or sore throat.  SKIN: No rash or itching.  CARDIOVASCULAR: per hpi RESPIRATORY: No shortness of breath, cough or sputum.  GASTROINTESTINAL: No anorexia, nausea, vomiting or diarrhea. No abdominal pain or blood.  GENITOURINARY: No burning on urination, no polyuria NEUROLOGICAL: No headache, dizziness, syncope, paralysis, ataxia, numbness or tingling in the extremities. No change in bowel or bladder control.  MUSCULOSKELETAL: No muscle, back pain, joint pain or stiffness.  LYMPHATICS: No enlarged nodes. No history of splenectomy.  PSYCHIATRIC: No history of depression or anxiety.  ENDOCRINOLOGIC: No reports of sweating, cold or heat intolerance. No polyuria or polydipsia.  Marland Kitchen   Physical Examination Today's Vitals   10/03/19 1258  BP: (!) 157/75  Pulse: 77  SpO2: 99%  Weight: (!) 268 lb (121.6 kg)  Height: 5' 7.5" (1.715 m)   Body mass index is 41.36 kg/m.  Gen: resting comfortably, no acute distress HEENT: no scleral icterus, pupils equal round and reactive, no palptable cervical adenopathy,  CV: RRR, 3/6 systolic murmur rusb, no jvd. Bilateral carotid bruits Resp: Clear to  auscultation bilaterally GI: abdomen is soft, non-tender, non-distended, normal bowel sounds, no hepatosplenomegaly MSK: extremities are warm, no edema.  Skin: warm, no rash Neuro:  no focal deficits Psych: appropriate affect   Diagnostic Studies  08/2016 echo Study Conclusions  - Left ventricle: The cavity size was normal. Wall thickness was increased in a pattern of mild LVH. Systolic function was vigorous. The estimated ejection fraction was in the range of 65% to 70%. - Aortic valve: AV ismoderately thickened, calcified with mildly restricted motion. Peak and mean gradients through the valve are 37 and 21 mm Hg respectively.  Jan 2021 echo IMPRESSIONS   1. Left ventricular ejection fraction, by visual estimation, is 65 to 70%. The left ventricle has hyperdynamic function. There is mildly increased left ventricular hypertrophy. 2. Elevated left ventricular end-diastolic pressure. 3. The left ventricle has no regional wall motion abnormalities. 4. Diastolic dysfunction, grade indeterminate. 5. Global right ventricle has normal systolic function.The right ventricular size is normal. No increase in right ventricular wall thickness. 6. Left atrial size was normal. 7. Right atrial size was normal. 8. Mild mitral annular calcification. 9. The mitral valve is grossly normal. Trivial mitral valve regurgitation. 10. The tricuspid valve is not well visualized. 11. The aortic valve was not well visualized. Aortic valve regurgitation is mild. Moderate to severe aortic valve stenosis. 12. Dimensionless index suggestive of moderate aortic valve stenosis but peak velocities are approaching 4 m/sec. 13. The pulmonic valve was not well visualized. Pulmonic valve regurgitation is not visualized. 14. Normal pulmonary artery systolic pressure. 15. The interatrial septum was not well visualized.   Assessment and Plan   1. Aortic stenosis -asymptomatic moderate to  severe AS - continue to monitor, repeat echo Jan 2022 after our appt at that time. Earlier if new symptoms.   2. Carotid stenosis - asymptomatic mild to moderate stenosis - continue medical therapy - repeat carotid US after our appt in Jan 2022  3. HTN -elevated here but home numbers at goal, continue current meds  4. Hyperlipidemia - she will continue statin, request pcp labs.      Arnoldo Lenis, M.D.

## 2020-02-19 ENCOUNTER — Other Ambulatory Visit (HOSPITAL_COMMUNITY): Payer: Self-pay | Admitting: Psychiatry

## 2020-03-21 ENCOUNTER — Ambulatory Visit (INDEPENDENT_AMBULATORY_CARE_PROVIDER_SITE_OTHER): Payer: Medicare PPO

## 2020-03-21 ENCOUNTER — Other Ambulatory Visit: Payer: Self-pay

## 2020-03-21 DIAGNOSIS — I6523 Occlusion and stenosis of bilateral carotid arteries: Secondary | ICD-10-CM

## 2020-03-26 ENCOUNTER — Telehealth: Payer: Self-pay | Admitting: *Deleted

## 2020-03-26 NOTE — Telephone Encounter (Signed)
-----   Message from Arnoldo Lenis, MD sent at 03/25/2020 11:58 AM EST ----- Mild to moderate stenosis by carotid US, will continue to monitor   Zandra Abts MD

## 2020-03-26 NOTE — Telephone Encounter (Signed)
Pt voiced understanding

## 2020-04-08 ENCOUNTER — Ambulatory Visit: Payer: Medicare PPO | Admitting: Cardiology

## 2020-04-09 ENCOUNTER — Encounter: Payer: Self-pay | Admitting: Cardiology

## 2020-04-09 ENCOUNTER — Ambulatory Visit: Payer: Medicare PPO | Admitting: Cardiology

## 2020-04-09 VITALS — BP 144/100 | HR 112 | Ht 67.5 in | Wt 257.8 lb

## 2020-04-09 DIAGNOSIS — I350 Nonrheumatic aortic (valve) stenosis: Secondary | ICD-10-CM | POA: Diagnosis not present

## 2020-04-09 DIAGNOSIS — I10 Essential (primary) hypertension: Secondary | ICD-10-CM | POA: Diagnosis not present

## 2020-04-09 DIAGNOSIS — I6523 Occlusion and stenosis of bilateral carotid arteries: Secondary | ICD-10-CM

## 2020-04-09 DIAGNOSIS — I4891 Unspecified atrial fibrillation: Secondary | ICD-10-CM | POA: Diagnosis not present

## 2020-04-09 MED ORDER — APIXABAN 5 MG PO TABS: 5.0000 mg | ORAL_TABLET | Freq: Two times a day (BID) | ORAL | 3 refills | Status: DC

## 2020-04-09 MED ORDER — METOPROLOL TARTRATE 25 MG PO TABS: 12.5000 mg | ORAL_TABLET | Freq: Two times a day (BID) | ORAL | 1 refills | Status: DC

## 2020-04-09 NOTE — Patient Instructions (Signed)
Your physician recommends that you schedule a follow-up appointment in: [place] physician has recommended you make the following change in your medication:   STOP ASPIRIN   START ELIQIUS 5 MG TWICE DAILY   START LOPRESSOR 12.5 MG (1/2 TABLET) TWICE DAILY   Your physician has requested that you have an echocardiogram. Echocardiography is a painless test that uses sound waves to create images of your heart. It provides your doctor with information about the size and shape of your heart and how well your heart's chambers and valves are working. This procedure takes approximately one hour. There are no restrictions for this procedure.  CALL us Friday WITH BLOOD PRESSURE AND HEART RATE   Thank you for choosing [facility]!!

## 2020-04-09 NOTE — Progress Notes (Signed)
Clinical Summary Jennifer Cochran is a 75 y.o.female seen today for follow up of the following medical problems.  1. Aortic stenosis - she reports a very long history of heart murmur - 08/2016 echo LVEF 65-70%, moderate AS by mean grad of 21 and AVA VTI 1.26  09/2018 echo LVEF >65%, mean grad 44 mmHg, AVA VTI 0.95, dimensionless index 0.38   Jan 2021 echo LVEF 65-70%, mod to severe AS. Mean grad 37, AVA VTI 1.10, dimensionless index 0.43  - no recent SOB/DOE, no chest pain.   2. HTN -compliatn with meds    3. Hyperlipidemia -labs followed by pcp  4. Carotidstenosis -01/2019 carotid US RICA 6-14%, LICA 43-15% - Jan 4008 carotid US: RICA 6-76%, LICA 19-50%  5. Polycystic ovaries - on aldactone  6. Afib - new diagnosis made today in clnic - no palpitations   SH: recent bus trip to [place] She has had covid vaccine   Past Medical History:  Diagnosis Date  . Heart murmur   . Hyperlipidemia   . Hypertension      No Known Allergies   Current Outpatient Medications  Medication Sig Dispense Refill  . aspirin (ECOTRIN LOW STRENGTH) 81 MG EC tablet Take 81 mg by mouth daily. Swallow whole.    . Calcium Carb-Cholecalciferol (CALCIUM 600 + D PO) Take 1 tablet by mouth 2 (two) times daily.    . Cholecalciferol (VITAMIN D3) 50 MCG (2000 UT) TABS Take by mouth.    . docusate sodium (COLACE) 100 MG capsule Take 100 mg by mouth 2 (two) times daily.    . Dulaglutide (TRULICITY) 1.5 DT/2.6ZT SOPN Inject into the skin.     Marland Kitchen losartan (COZAAR) 50 MG tablet Take 50 mg by mouth daily.    . medroxyPROGESTERone (PROVERA) 2.5 MG tablet Take 2.5 mg by mouth as directed. TAKES ONE TABLET FOR 7 DAYS EVERY 3 MONTHS    . omeprazole (PRILOSEC) 20 MG capsule Take 20 mg by mouth daily.    . rosuvastatin (CRESTOR) 40 MG tablet Take 40 mg by mouth daily.    Marland Kitchen spironolactone (ALDACTONE) 25 MG tablet Take 25 mg by mouth 3 (three) times daily.     No current  facility-administered medications for this visit.     Past Surgical History:  Procedure Laterality Date  . COLONOSCOPY N/A 05/25/2013   Procedure: COLONOSCOPY;  Surgeon: Rogene Houston, MD;  [facility];  Service: Endoscopy;  Laterality: N/A;  1030  . MELANOMA EXCISION  may 2014   [place] Dr. [place] Cr[place]  . tooth removal  late 70's     No Known Allergies    Family History  Problem Relation Age of Onset  . Stroke Mother   . Heart failure Father      Social History Jennifer Cochran reports that she has never smoked. She has never used smokeless tobacco. Jennifer Cochran reports no history of alcohol use.   Review of Systems CONSTITUTIONAL: No weight loss, fever, chills, weakness or fatigue.  HEENT: Eyes: No visual loss, blurred vision, double vision or yellow sclerae.No hearing loss, sneezing, congestion, runny nose or sore throat.  SKIN: No rash or itching.  CARDIOVASCULAR: per hpi RESPIRATORY: No shortness of breath, cough or sputum.  GASTROINTESTINAL: No anorexia, nausea, vomiting or diarrhea. No abdominal pain or blood.  GENITOURINARY: No burning on urination, no polyuria NEUROLOGICAL: No headache, dizziness, syncope, paralysis, ataxia, numbness or tingling in the extremities. No change in bowel or bladder control.  MUSCULOSKELETAL: No  muscle, back pain, joint pain or stiffness.  LYMPHATICS: No enlarged nodes. No history of splenectomy.  PSYCHIATRIC: No history of depression or anxiety.  ENDOCRINOLOGIC: No reports of sweating, cold or heat intolerance. No polyuria or polydipsia.  Marland Kitchen   Physical Examination Today's Vitals   04/09/20 1402  BP: (!) 144/100  Pulse: (!) 112  SpO2: 99%  Weight: 257 lb 12.8 oz (116.9 kg)  Height: 5' 7.5" (1.715 m)   Body mass index is 39.78 kg/m.  Gen: resting comfortably, no acute distress HEENT: no scleral icterus, pupils equal round and reactive, no palptable cervical adenopathy,  CV: irreg, 3/6 systolic murmur rusb, no  jvd. Bilateral carotid bruits Resp: Clear to auscultation bilaterally GI: abdomen is soft, non-tender, non-distended, normal bowel sounds, no hepatosplenomegaly MSK: extremities are warm, no edema.  Skin: warm, no rash Neuro:  no focal deficits Psych: appropriate affect   Diagnostic Studies 08/2016 echo Study Conclusions  - Left ventricle: The cavity size was normal. Wall thickness was increased in a pattern of mild LVH. Systolic function was vigorous. The estimated ejection fraction was in the range of 65% to 70%. - Aortic valve: AV ismoderately thickened, calcified with mildly restricted motion. Peak and mean gradients through the valve are 37 and 21 mm Hg respectively.  Jan 2021 echo IMPRESSIONS   1. Left ventricular ejection fraction, by visual estimation, is 65 to 70%. The left ventricle has hyperdynamic function. There is mildly increased left ventricular hypertrophy. 2. Elevated left ventricular end-diastolic pressure. 3. The left ventricle has no regional wall motion abnormalities. 4. Diastolic dysfunction, grade indeterminate. 5. Global right ventricle has normal systolic function.The right ventricular size is normal. No increase in right ventricular wall thickness. 6. Left atrial size was normal. 7. Right atrial size was normal. 8. Mild mitral annular calcification. 9. The mitral valve is grossly normal. Trivial mitral valve regurgitation. 10. The tricuspid valve is not well visualized. 11. The aortic valve was not well visualized. Aortic valve regurgitation is mild. Moderate to severe aortic valve stenosis. 12. Dimensionless index suggestive of moderate aortic valve stenosis but peak velocities are approaching 4 m/sec. 13. The pulmonic valve was not well visualized. Pulmonic valve regurgitation is not visualized. 14. Normal pulmonary artery systolic pressure. 15. The interatrial septum was not well visualized.    Assessment and Plan    1. Aortic stenosis -asymptomatic moderate to severe AS - repeat echo  2. Carotid stenosis - asymptomatic mild to moderate stenosis - we will continue to monitor  3. HTN - elevated but home numbers at goal  4. Afib - new diagnosis today, HRs 110s, EKG shows afib - start lopressor 12.5mg  bid - CHADS2Vasc score is 4, start eliquis 5mg  bid and stop ASA - call Friday with bp's and HRs at home    Arnoldo Lenis, M.D.

## 2020-04-10 ENCOUNTER — Ambulatory Visit (INDEPENDENT_AMBULATORY_CARE_PROVIDER_SITE_OTHER): Payer: Medicare PPO

## 2020-04-10 DIAGNOSIS — I350 Nonrheumatic aortic (valve) stenosis: Secondary | ICD-10-CM

## 2020-04-10 LAB — ECHOCARDIOGRAM COMPLETE
AR max vel: 0.89 cm2
AV Area VTI: 0.99 cm2
AV Area mean vel: 0.91 cm2
AV Mean grad: 41.3 mmHg
AV Peak grad: 65.5 mmHg
Ao pk vel: 4.05 m/s
Area-P 1/2: 2.5 cm2
Calc EF: 75.4 %
S' Lateral: 2.45 cm
Single Plane A2C EF: 58.7 %
Single Plane A4C EF: 86.1 %

## 2020-04-12 ENCOUNTER — Telehealth: Payer: Self-pay | Admitting: Cardiology

## 2020-04-12 NOTE — Telephone Encounter (Signed)
Patient called to report recent BP Readings:  04/10/2020 123/71  HR 73 04/11/2020 129/72  HR 72 04/12/2020 134/80  HR 73

## 2020-04-12 NOTE — Telephone Encounter (Signed)
Pt aware.

## 2020-04-12 NOTE — Telephone Encounter (Signed)
Numbers look good, no changes   J Branch MD 

## 2020-04-15 ENCOUNTER — Telehealth: Payer: Self-pay | Admitting: *Deleted

## 2020-04-15 NOTE — Telephone Encounter (Signed)
-----   Message from Arnoldo Lenis, MD sent at 04/15/2020 12:04 PM EST ----- Aortic stenosis remains moderate to severe, more favoring the severe end of the spectrum. Continue to monitor closely at this time  Zandra Abts MD

## 2020-04-15 NOTE — Telephone Encounter (Signed)
Pt voiced understanding

## 2020-05-20 NOTE — Progress Notes (Signed)
Cardiology Office Note  Date: 05/21/2020   ID: ZAHRIAH ROES, DOB 1946-02-22, MRN [id number]  PCP:  Denyce Robert, FNP  Cardiologist:  Carlyle Dolly, MD Electrophysiologist:  None   Chief Complaint: follow up aortic stenosis, afib.  History of Present Illness: Jennifer Cochran is a 75 y.o. female with a history of aortic stenosis, atrial fibrillation, hyperlipidemia, PCOS.  Last seen by Dr. Harl Bowie 04/09/2020. AS moderate to severe but asymptomatic. Repeat echocardiogram was ordered. Asymptomatic carotid stenosis. Continuing to monitor. BP was elevated but home numbers at goal. New dx of atrial fibrillation with HR 110s. Metoprolol 12.5 mg po bid ordered. CHADS2VASc 4. Eliquis started 5 mg po bid and ASA stopped.   She is here for follow-up status post recent echocardiogram and recent discovery of new onset atrial fibrillation.  At last visit she was started on metoprolol 12.5 mg p.o. twice daily and Eliquis 5 mg p.o. twice daily.  CHA2DS2-VASc score of 4.  She denies any recent palpitations.  Blood pressure today is 130/94.  Patient brought her blood pressure monitor with her today.  Blood pressures are running in the 110s to 120s over 60s.  Heart rates running in the 60s and 70s.  Denies any orthostatic symptoms, CVA or TIA-like symptoms, PND, orthopnea, bleeding.  Had a recent echocardiogram which showed moderate to severe aortic stenosis.  Past Medical History:  Diagnosis Date  . Heart murmur   . Hyperlipidemia   . Hypertension     Past Surgical History:  Procedure Laterality Date  . COLONOSCOPY N/A 05/25/2013   Procedure: COLONOSCOPY;  Surgeon: Rogene Houston, MD;  [facility];  Service: Endoscopy;  Laterality: N/A;  1030  . MELANOMA EXCISION  may 2014   [place] Dr. [place] Crane  . tooth removal  late 70's    Current Outpatient Medications  Medication Sig Dispense Refill  . apixaban (ELIQUIS) 5 MG TABS tablet Take 1 tablet (5 mg total) by mouth 2  (two) times daily. 60 tablet 3  . Calcium Carb-Cholecalciferol (CALCIUM 600 + D PO) Take 1 tablet by mouth 2 (two) times daily.    . Cholecalciferol (VITAMIN D3) 50 MCG (2000 UT) TABS Take 1 tablet by mouth daily at 6 (six) AM.    . docusate sodium (COLACE) 100 MG capsule Take 100 mg by mouth 2 (two) times daily.    . Dulaglutide 1.5 MG/0.5ML SOPN Inject 2 mg into the skin once a week.    . losartan (COZAAR) 50 MG tablet Take 50 mg by mouth daily.    . medroxyPROGESTERone (PROVERA) 2.5 MG tablet Take 2.5 mg by mouth as directed. Take one tablet by mouth daily for first 7 days of each season every 3 months    . metoprolol tartrate (LOPRESSOR) 25 MG tablet Take 0.5 tablets (12.5 mg total) by mouth 2 (two) times daily. 90 tablet 1  . omeprazole (PRILOSEC) 20 MG capsule Take 20 mg by mouth daily as needed.    . rosuvastatin (CRESTOR) 40 MG tablet Take 40 mg by mouth daily.    Marland Kitchen spironolactone (ALDACTONE) 25 MG tablet Take 25 mg by mouth as directed. 3-4 times per day     No current facility-administered medications for this visit.   Allergies:  Sulfamethoxazole-trimethoprim   Social History: The patient  reports that she has never smoked. She has never used smokeless tobacco. She reports that she does not drink alcohol and does not use drugs.   Family History: The patient's family history includes  Heart failure in her father; Stroke in her mother.   ROS:  Please see the history of present illness. Otherwise, complete review of systems is positive for none.  All other systems are reviewed and negative.   Physical Exam: VS:  BP (!) 130/94   Pulse 79   Ht 5' 7.5" (1.715 m)   Wt 265 lb (120.2 kg)   SpO2 95%   BMI 40.89 kg/m , BMI Body mass index is 40.89 kg/m.  Wt Readings from Last 3 Encounters:  05/21/20 265 lb (120.2 kg)  04/09/20 257 lb 12.8 oz (116.9 kg)  10/03/19 (!) 268 lb (121.6 kg)    General: Morbidly obese patient appears comfortable at rest. Neck: Supple, no elevated JVP or  carotid bruits, no thyromegaly. Lungs: Clear to auscultation, nonlabored breathing at rest. Cardiac: Regular rate and rhythm, no S3 or significant systolic murmur, no pericardial rub. Extremities: No pitting edema, distal pulses 2+. Skin: Warm and dry. Musculoskeletal: No kyphosis. Neuropsychiatric: Alert and oriented x3, affect grossly appropriate.  ECG:  An ECG dated 05/21/2020 was personally reviewed today and demonstrated:  Normal sinus rhythm rate of 69.  Recent Labwork: No results found for requested labs within last 8760 hours.  No results found for: CHOL, TRIG, HDL, CHOLHDL, VLDL, LDLCALC, LDLDIRECT  Other Studies Reviewed Today:  Echocardiogram 04/10/2020 1. Images are limited. 2. Left ventricular ejection fraction, by estimation, is 70 to 75%. The left ventricle has hyperdynamic function. The left ventricle has no regional wall motion abnormalities. There is moderate left ventricular hypertrophy. Left ventricular diastolic parameters are indeterminate. 3. Right ventricular systolic function is normal. The right ventricular size is normal. 4. Left atrial size was upper normal. 5. The mitral valve is abnormal. Trivial mitral valve regurgitation. Moderate mitral annular calcification. 6. The aortic valve is tricuspid. There is moderate calcification of the aortic valve. Aortic valve regurgitation is trivial. Severe aortic valve stenosis. Aortic valve mean gradient measures 41.3 mmHg. Dimentionless index 0.35. 7. Unable to estimate CVP. 8. Cannot exclude PFO with small left to right shunt. Comparison(s): Echocardiogram done 03/22/19 showed an EF of 65-70% with moderate to severe AS and an AV Peak Grad of 60.4 mmHg.   Carotid artery duplex 03/22/2020 Right Carotid: Velocities in the right ICA are consistent with a 1-39% stenosis. Left Carotid: Velocities in the left ICA are consistent with a 60-79% stenosis. Vertebrals: Bilateral vertebral arteries demonstrate antegrade flow.  Equal brachial BP's. Subclavians: Left subclavian artery flow was disturbed. Normal flow hemodynamics were seen in the right subclavian artery.    08/2016 echo Study Conclusions  - Left ventricle: The cavity size was normal. Wall thickness was increased in a pattern of mild LVH. Systolic function was vigorous. The estimated ejection fraction was in the range of 65% to 70%. - Aortic valve: AV ismoderately thickened, calcified with mildly restricted motion. Peak and mean gradients through the valve are 37 and 21 mm Hg respectively.  Jan 2021 echo IMPRESSIONS 1. Left ventricular ejection fraction, by visual estimation, is 65 to 70%. The left ventricle has hyperdynamic function. There is mildly increased left ventricular hypertrophy. 2. Elevated left ventricular end-diastolic pressure. 3. The left ventricle has no regional wall motion abnormalities. 4. Diastolic dysfunction, grade indeterminate. 5. Global right ventricle has normal systolic function.The right ventricular size is normal. No increase in right ventricular wall thickness. 6. Left atrial size was normal. 7. Right atrial size was normal. 8. Mild mitral annular calcification. 9. The mitral valve is grossly normal. Trivial mitral valve regurgitation.  10. The tricuspid valve is not well visualized. 11. The aortic valve was not well visualized. Aortic valve regurgitation is mild. Moderate to severe aortic valve stenosis. 12. Dimensionless index suggestive of moderate aortic valve stenosis but peak velocities are approaching 4 m/sec. 13. The pulmonic valve was not well visualized. Pulmonic valve regurgitation is not visualized. 14. Normal pulmonary artery systolic pressure. 15. The interatrial septum was not well visualized.   Echocardiogram 09/28/2018 IMPRESSIONS  1. The left ventricle has hyperdynamic systolic function, with an  ejection fraction of >65%. The cavity size was normal. Left ventricular   diastolic Doppler parameters are consistent with impaired relaxation.  2. The right ventricle has normal systolic function. The cavity was  normal. There is no increase in right ventricular wall thickness. Right  ventricular systolic pressure is normal with an estimated pressure of 32.6  mmHg.  3. The aortic valve is tricuspid. Moderate calcification of the aortic  valve. Aortic valve regurgitation is trivial by color flow Doppler. Severe  stenosis of the aortic valve based on gradients although dimentionaless  index is moderate range at 0.38.  Moderate aortic annular calcification noted.  4. The mitral valve is grossly normal. There is mild to moderate mitral  annular calcification present. There is mild mitral regurgitation.  5. The tricuspid valve is grossly normal.  6. The aorta is normal in size and structure.  Assessment and Plan:  1. Nonrheumatic aortic valve stenosis   2. Bilateral carotid artery stenosis   3. Essential hypertension   4. Atrial fibrillation, unspecified type ([place])    1. Nonrheumatic aortic valve stenosis Recent echocardiogram 04/10/2020 EF 70 to 75%.  No WMA's.  Moderate LVH.  Trivial MR, severe aortic valve stenosis.  Mean gradient 41.3 mm.  Dimensionless index 0.35.  Continuing observation.  Patient is asymptomatic.  Continue observation.  2. Bilateral carotid artery stenosis Recent carotid artery duplex 03/22/2020 R ICA 1 to 19%, LICA 60 to 14%.  Bilateral vertebral artery demonstrated antegrade flow.  Left subclavian artery flow was disturbed.  Normal flow dynamics in right subclavian.  She is asymptomatic.  Continues with bilateral carotid artery bruits.  3. Essential hypertension Pressure 130/94 today.  Patient brings her BP monitor with her today.  Blood pressures are running in the 100 teens to 782 systolic with diastolic blood pressures in the 60s to 70s.  Continue losartan 50 mg daily.  Continue spironolactone 25 mg daily.  Continue metoprolol 12.5  mg p.o. twice daily.  4. Atrial fibrillation, unspecified type [facility]) Patient has converted back to normal sinus rhythm today.  EKG shows normal sinus rhythm rate of 69.  Continue metoprolol 12.5 mg p.o. twice daily.  Continue Eliquis 5 mg p.o. twice daily.  Denies any palpitations or arrhythmias.  No bleeding issues.  Medication Adjustments/Labs and Tests Ordered: Current medicines are reviewed at length with the patient today.  Concerns regarding medicines are outlined above.   Disposition: Follow-up with Dr. Harl Bowie or APP  Signed, Jennifer July, NP 05/21/2020 11:31 AM    [facility] [place], [place], [place] [address] Phone: [phone]; Fax: [phone]

## 2020-05-21 ENCOUNTER — Encounter: Payer: Self-pay | Admitting: Family Medicine

## 2020-05-21 ENCOUNTER — Ambulatory Visit (INDEPENDENT_AMBULATORY_CARE_PROVIDER_SITE_OTHER): Payer: Medicare PPO | Admitting: Family Medicine

## 2020-05-21 VITALS — BP 130/94 | HR 79 | Ht 67.5 in | Wt 265.0 lb

## 2020-05-21 DIAGNOSIS — I10 Essential (primary) hypertension: Secondary | ICD-10-CM | POA: Diagnosis not present

## 2020-05-21 DIAGNOSIS — I350 Nonrheumatic aortic (valve) stenosis: Secondary | ICD-10-CM | POA: Diagnosis not present

## 2020-05-21 DIAGNOSIS — I6523 Occlusion and stenosis of bilateral carotid arteries: Secondary | ICD-10-CM | POA: Diagnosis not present

## 2020-05-21 DIAGNOSIS — I4891 Unspecified atrial fibrillation: Secondary | ICD-10-CM | POA: Diagnosis not present

## 2020-05-21 NOTE — Patient Instructions (Signed)
Your physician recommends that you schedule a follow-up appointment in: 3 MONTHS WITH ANDY QUINN, NP  Your physician recommends that you continue on your current medications as directed. Please refer to the Current Medication list given to you today.  Thank you for choosing [facility]!!   

## 2020-07-31 ENCOUNTER — Other Ambulatory Visit: Payer: Self-pay | Admitting: Cardiology

## 2020-08-01 NOTE — Telephone Encounter (Signed)
Prescription refill request for Eliquis received. Indication: A Fib Last office visit: 05/21/20 Jennifer Cousins FNP Scr: 0.76 on 06/07/20 Age: 75 Weight: 120.2kg  Based on above findings Eliquis 5mg  twice daily is the appropriate dose.  Refill approved.

## 2020-08-21 NOTE — Progress Notes (Signed)
Cardiology Office Note  Date: 08/22/2020   ID: Jennifer Cochran, DOB June 03, 1945, MRN [id number]  PCP:  Denyce Robert, FNP  Cardiologist:  Carlyle Dolly, MD Electrophysiologist:  None   Chief Complaint: follow up aortic stenosis, afib.  History of Present Illness: Jennifer Cochran is a 75 y.o. female with a history of aortic stenosis, atrial fibrillation, hyperlipidemia, PCOS.  Last seen by Dr. Harl Bowie 04/09/2020. AS moderate to severe but asymptomatic. Repeat echocardiogram was ordered. Asymptomatic carotid stenosis. Continuing to monitor. BP was elevated but home numbers at goal. New dx of atrial fibrillation with HR 110s. Metoprolol 12.5 mg po bid ordered. CHADS2VASc 4. Eliquis started 5 mg po bid and ASA stopped.   She was last here for follow-up status post recent echocardiogram and recent discovery of new onset atrial fibrillation.  At last visit she was started on metoprolol 12.5 mg p.o. twice daily and Eliquis 5 mg p.o. twice daily.  CHA2DS2-VASc score of 4.  She denies any recent palpitations.  Blood pressure today is 130/94.  Patient brought her blood pressure monitor with her today.  Blood pressures are running in the 110s to 120s over 60s.  Heart rates running in the 60s and 70s.  Denies any orthostatic symptoms, CVA or TIA-like symptoms, PND, orthopnea, bleeding.  Had a recent echocardiogram which showed moderate to severe aortic stenosis.  He is here for 65-month follow-up today.  She denies any recent issues.  No recent acute illnesses or hospitalizations.  Blood pressure was elevated on arrival at 158/72.  Recheck in right arm was 120/80.  She states during the interim since last visit she had an episode of irregular heart rhythm/palpitations but it resolved.  She denies any further issues.  Pulse is regular today with a rate of 75.  She continues on metoprolol 12.5 mg p.o. twice daily, Eliquis 5 mg p.o. twice daily.  She states her primary care provider told her her  LDL cholesterol was elevated.  Lab work from PCP office shows Lab work from 06/07/2020 with glucose of 151.  Lipid panel with TC of 172, TG 177, HDL 38, LDL 103.  Hemoglobin A1c 6.7%.  She states she has some upcoming repeat lab work.  She is currently taking Crestor 40 mg daily for hyperlipidemia.  She denies any anginal or exertional symptoms, recurrent palpitations or arrhythmias, orthostatic symptoms, CVA or TIA-like symptoms, PND, orthopnea, bleeding issues.  No claudication-like symptoms, DVT or PE-like symptoms, or lower extremity edema.  We discussed last echo showing evidence of severe aortic stenosis.  However she is asymptomatic.  We discussed getting a follow-up echocardiogram in 6 months and possible future referral to valve clinic depending on symptoms.  Past Medical History:  Diagnosis Date   Heart murmur    Hyperlipidemia    Hypertension     Past Surgical History:  Procedure Laterality Date   COLONOSCOPY N/A 05/25/2013   Procedure: COLONOSCOPY;  Surgeon: Jennifer Houston, MD;  [facility];  Service: Endoscopy;  Laterality: N/A;  [place]  may 2014   [place]. [place] Cochran   tooth removal  late 70's    Current Outpatient Medications  Medication Sig Dispense Refill   Calcium Carb-Cholecalciferol (CALCIUM 600 + D PO) Take 1 tablet by mouth 2 (two) times daily.     Cholecalciferol (VITAMIN D3) 50 MCG (2000 UT) TABS Take 1 tablet by mouth daily at 6 (six) AM.     docusate sodium (COLACE) 100 MG capsule Take  100 mg by mouth 2 (two) times daily.     Dulaglutide 1.5 MG/0.5ML SOPN Inject 2 mg into the skin once a week.     ELIQUIS 5 MG TABS tablet TAKE (1) TABLET BY MOUTH TWICE DAILY. 60 tablet 6   losartan (COZAAR) 50 MG tablet Take 50 mg by mouth daily.     medroxyPROGESTERone (PROVERA) 2.5 MG tablet Take 2.5 mg by mouth as directed. Take one tablet by mouth daily for first 7 days of each season every 3 months     metoprolol tartrate (LOPRESSOR) 25 MG tablet  Take 0.5 tablets (12.5 mg total) by mouth 2 (two) times daily. 90 tablet 1   omeprazole (PRILOSEC) 20 MG capsule Take 20 mg by mouth daily as needed.     rosuvastatin (CRESTOR) 40 MG tablet Take 40 mg by mouth daily.     spironolactone (ALDACTONE) 25 MG tablet Take 25 mg by mouth as directed. 3-4 times per day     No current facility-administered medications for this visit.   Allergies:  Sulfamethoxazole-trimethoprim   Social History: The patient  reports that she has never smoked. She has never used smokeless tobacco. She reports that she does not drink alcohol and does not use drugs.   Family History: The patient's family history includes Heart failure in her father; Stroke in her mother.   ROS:  Please see the history of present illness. Otherwise, complete review of systems is positive for none.  All other systems are reviewed and negative.   Physical Exam: VS:  BP (!) 158/72   Pulse 75   Ht 5' 7.5" (1.715 m)   Wt 261 lb 6.4 oz (118.6 kg)   SpO2 98%   BMI 40.34 kg/m , BMI Body mass index is 40.34 kg/m.  Wt Readings from Last 3 Encounters:  08/22/20 261 lb 6.4 oz (118.6 kg)  05/21/20 265 lb (120.2 kg)  04/09/20 257 lb 12.8 oz (116.9 kg)    General: Morbidly obese patient appears comfortable at rest. Neck: Supple, no elevated JVP or carotid bruits, no thyromegaly. Lungs: Clear to auscultation, nonlabored breathing at rest. Cardiac: Regular rate and rhythm, no S3 or significant systolic murmur, no pericardial rub. Extremities: No pitting edema, distal pulses 2+. Skin: Warm and dry. Musculoskeletal: No kyphosis. Neuropsychiatric: Alert and oriented x3, affect grossly appropriate.  ECG:  An ECG dated 05/21/2020 was personally reviewed today and demonstrated:  Normal sinus rhythm rate of 69.  Recent Labwork: No results found for requested labs within last 8760 hours.  No results found for: CHOL, TRIG, HDL, CHOLHDL, VLDL, LDLCALC, LDLDIRECT  Other Studies Reviewed  Today:  Echocardiogram 04/10/2020 1. Images are limited. 2. Left ventricular ejection fraction, by estimation, is 70 to 75%. The left ventricle has hyperdynamic function. The left ventricle has no regional wall motion abnormalities. There is moderate left ventricular hypertrophy. Left ventricular diastolic parameters are indeterminate. 3. Right ventricular systolic function is normal. The right ventricular size is normal. 4. Left atrial size was upper normal. 5. The mitral valve is abnormal. Trivial mitral valve regurgitation. Moderate mitral annular calcification. 6. The aortic valve is tricuspid. There is moderate calcification of the aortic valve. Aortic valve regurgitation is trivial. Severe aortic valve stenosis. Aortic valve mean gradient measures 41.3 mmHg. Dimentionless index 0.35. 7. Unable to estimate CVP. 8. Cannot exclude PFO with small left to right shunt. Comparison(s): Echocardiogram done 03/22/19 showed an EF of 65-70% with moderate to severe AS and an AV Peak Grad of 60.4 mmHg.  Carotid artery duplex 03/22/2020 Right Carotid: Velocities in the right ICA are consistent with a 1-39% stenosis. Left Carotid: Velocities in the left ICA are consistent with a 60-79% stenosis. Vertebrals: Bilateral vertebral arteries demonstrate antegrade flow. Equal brachial BP's. Subclavians: Left subclavian artery flow was disturbed. Normal flow hemodynamics were seen in the right subclavian artery.    08/2016 echo Study Conclusions   - Left ventricle: The cavity size was normal. Wall thickness was   increased in a pattern of mild LVH. Systolic function was   vigorous. The estimated ejection fraction was in the range of 65%   to 70%. - Aortic valve: AV ismoderately thickened, calcified with mildly   restricted motion. Peak and mean gradients through the valve are   37 and 21 mm Hg respectively.   Jan 2021 echo IMPRESSIONS   1. Left ventricular ejection fraction, by visual estimation,  is 65 to 70%. The left ventricle has hyperdynamic function. There is mildly increased left ventricular hypertrophy.  2. Elevated left ventricular end-diastolic pressure.  3. The left ventricle has no regional wall motion abnormalities.  4. Diastolic dysfunction, grade indeterminate.  5. Global right ventricle has normal systolic function.The right ventricular size is normal. No increase in right ventricular wall thickness.  6. Left atrial size was normal.  7. Right atrial size was normal.  8. Mild mitral annular calcification.  9. The mitral valve is grossly normal. Trivial mitral valve regurgitation. 10. The tricuspid valve is not well visualized. 11. The aortic valve was not well visualized. Aortic valve regurgitation is mild. Moderate to severe aortic valve stenosis. 12. Dimensionless index suggestive of moderate aortic valve stenosis but peak velocities are approaching 4 m/sec. 13. The pulmonic valve was not well visualized. Pulmonic valve regurgitation is not visualized. 14. Normal pulmonary artery systolic pressure. 15. The interatrial septum was not well visualized.    Echocardiogram 09/28/2018  IMPRESSIONS   1. The left ventricle has hyperdynamic systolic function, with an  ejection fraction of >65%. The cavity size was normal. Left ventricular  diastolic Doppler parameters are consistent with impaired relaxation.   2. The right ventricle has normal systolic function. The cavity was  normal. There is no increase in right ventricular wall thickness. Right  ventricular systolic pressure is normal with an estimated pressure of 32.6  mmHg.   3. The aortic valve is tricuspid. Moderate calcification of the aortic  valve. Aortic valve regurgitation is trivial by color flow Doppler. Severe  stenosis of the aortic valve based on gradients although dimentionaless  index is moderate range at 0.38.  Moderate aortic annular calcification noted.   4. The mitral valve is grossly normal. There  is mild to moderate mitral  annular calcification present. There is mild mitral regurgitation.   5. The tricuspid valve is grossly normal.   6. The aorta is normal in size and structure.  Assessment and Plan:  1. Nonrheumatic aortic valve stenosis   2. Bilateral carotid artery stenosis   3. Essential hypertension   4. Atrial fibrillation, unspecified type ([place])     1. Nonrheumatic aortic valve stenosis Recent echocardiogram 04/10/2020 EF 70 to 75%.  No WMA's.  Moderate LVH.  Trivial MR, severe aortic valve stenosis.  Mean gradient 41.3 mm.  Dimensionless index 0.35.  Continuing observation.  Patient is asymptomatic.  Get a follow-up echocardiogram at the end of August (6 months) to recheck aortic stenosis.  2. Bilateral carotid artery stenosis Recent carotid artery duplex 03/22/2020 R ICA 1 to 16%, LICA 60 to  79%.  Bilateral vertebral artery demonstrated antegrade flow.  Left subclavian artery flow was disturbed.  Normal flow dynamics in right subclavian.  She is asymptomatic.  Continues with bilateral carotid artery bruits.  She denies any neurologic symptoms.  3. Essential hypertension Initial blood pressure today was 158/72.  Recheck in right arm was 120/80.  Continue losartan 50 mg daily.  Continue spironolactone 25 mg daily.  Continue metoprolol 12.5 mg p.o. twice daily.  4. Atrial fibrillation, unspecified type ([facility]) Pulse was regular today.  Patient states in the interim since last visit she had 1 episode of irregular heartbeat but this resolved.  On previous visit her EKG showed normal sinus rhythm.  Continue metoprolol 12.5 mg p.o. twice daily.  Continue Eliquis 5 mg p.o. twice daily.  Denies any palpitations or arrhythmias.  No bleeding issues.  Medication Adjustments/Labs and Tests Ordered: Current medicines are reviewed at length with the patient today.  Concerns regarding medicines are outlined above.   Disposition: Follow-up with Dr. Harl Bowie or APP 6 months. Signed, Levell July, NP 08/22/2020 10:05 AM    [facility] [place], [place], [place] [address] Phone: [phone]; Fax: [phone]

## 2020-08-22 ENCOUNTER — Ambulatory Visit (INDEPENDENT_AMBULATORY_CARE_PROVIDER_SITE_OTHER): Payer: Medicare PPO | Admitting: Family Medicine

## 2020-08-22 ENCOUNTER — Encounter: Payer: Self-pay | Admitting: Family Medicine

## 2020-08-22 VITALS — BP 158/72 | HR 75 | Ht 67.5 in | Wt 261.4 lb

## 2020-08-22 DIAGNOSIS — I4891 Unspecified atrial fibrillation: Secondary | ICD-10-CM | POA: Diagnosis not present

## 2020-08-22 DIAGNOSIS — I350 Nonrheumatic aortic (valve) stenosis: Secondary | ICD-10-CM

## 2020-08-22 DIAGNOSIS — I10 Essential (primary) hypertension: Secondary | ICD-10-CM

## 2020-08-22 DIAGNOSIS — I6523 Occlusion and stenosis of bilateral carotid arteries: Secondary | ICD-10-CM | POA: Diagnosis not present

## 2020-08-22 NOTE — Patient Instructions (Signed)
Medication Instructions:  Continue all current medications.  Labwork: none  Testing/Procedures: Your physician has requested that you have an echocardiogram. Echocardiography is a painless test that uses sound waves to create images of your heart. It provides your doctor with information about the size and shape of your heart and how well your heart's chambers and valves are working. This procedure takes approximately one hour. There are no restrictions for this procedure - DUE JUST PRIOR TO NEXT VISIT   Follow-Up: 6 months   Any Other Special Instructions Will Be Listed Below (If Applicable).  If you need a refill on your cardiac medications before your next appointment, please call your pharmacy.

## 2020-09-30 ENCOUNTER — Other Ambulatory Visit: Payer: Self-pay | Admitting: Cardiology

## 2020-12-17 ENCOUNTER — Other Ambulatory Visit (HOSPITAL_COMMUNITY): Payer: Self-pay | Admitting: Cardiology

## 2020-12-17 DIAGNOSIS — I6523 Occlusion and stenosis of bilateral carotid arteries: Secondary | ICD-10-CM

## 2021-02-07 ENCOUNTER — Other Ambulatory Visit: Payer: Self-pay | Admitting: Cardiology

## 2021-02-10 NOTE — Telephone Encounter (Signed)
Prescription refill request for Eliquis received. Indication: Atrial fib Last office visit: 08/22/20  [name] FNP Scr: 0.76 on 06/07/20 Age: 75 Weight: 118.6kg  Based on above findings Eliquis 5mg  twice daily is the appropriate dose.  Pt is past due for lab work.  She has an appt with Dr Harl Bowie on 02/21/21.  Requested CBC/BMP at that time. Refill approved x 1

## 2021-02-13 ENCOUNTER — Other Ambulatory Visit: Payer: Self-pay

## 2021-02-13 ENCOUNTER — Ambulatory Visit (INDEPENDENT_AMBULATORY_CARE_PROVIDER_SITE_OTHER): Payer: Medicare PPO

## 2021-02-13 DIAGNOSIS — I350 Nonrheumatic aortic (valve) stenosis: Secondary | ICD-10-CM

## 2021-02-13 LAB — ECHOCARDIOGRAM COMPLETE
AR max vel: 0.77 cm2
AV Area VTI: 0.75 cm2
AV Area mean vel: 0.75 cm2
AV Mean grad: 64 mmHg
AV Peak grad: 99.3 mmHg
Ao pk vel: 4.98 m/s
Area-P 1/2: 2.33 cm2
S' Lateral: 2.36 cm
Single Plane A4C EF: 74.8 %

## 2021-02-14 ENCOUNTER — Telehealth: Payer: Self-pay | Admitting: *Deleted

## 2021-02-14 NOTE — Telephone Encounter (Signed)
-----   Message from Satira Sark, MD sent at 02/13/2021  5:17 PM EST ----- Patient of Dr. Harl Bowie.  Echocardiogram was ordered by Mr. Leonides Sake NP and routed to me.  LVEF remains vigorous, however there is evidence of progressive, severe calcific aortic stenosis.  Please make sure that the patient has follow-up soon with Dr. Harl Bowie for further clinical evaluation and discussion.

## 2021-02-14 NOTE — Telephone Encounter (Signed)
Patient informed. Copy sent to PCP °

## 2021-02-18 ENCOUNTER — Encounter (INDEPENDENT_AMBULATORY_CARE_PROVIDER_SITE_OTHER): Payer: Self-pay | Admitting: *Deleted

## 2021-02-21 ENCOUNTER — Other Ambulatory Visit: Payer: Self-pay

## 2021-02-21 ENCOUNTER — Encounter: Payer: Self-pay | Admitting: *Deleted

## 2021-02-21 ENCOUNTER — Ambulatory Visit (INDEPENDENT_AMBULATORY_CARE_PROVIDER_SITE_OTHER): Payer: Medicare PPO | Admitting: Cardiology

## 2021-02-21 ENCOUNTER — Other Ambulatory Visit: Payer: Self-pay | Admitting: Cardiology

## 2021-02-21 ENCOUNTER — Encounter: Payer: Self-pay | Admitting: Cardiology

## 2021-02-21 ENCOUNTER — Telehealth: Payer: Self-pay | Admitting: Cardiology

## 2021-02-21 VITALS — BP 140/92 | HR 81 | Ht 67.5 in | Wt 257.2 lb

## 2021-02-21 DIAGNOSIS — I350 Nonrheumatic aortic (valve) stenosis: Secondary | ICD-10-CM

## 2021-02-21 DIAGNOSIS — I4891 Unspecified atrial fibrillation: Secondary | ICD-10-CM | POA: Diagnosis not present

## 2021-02-21 DIAGNOSIS — Z79899 Other long term (current) drug therapy: Secondary | ICD-10-CM

## 2021-02-21 NOTE — Telephone Encounter (Signed)
Checking percert on the following patient for testing scheduled at [facility].     GXT   03-04-2021

## 2021-02-21 NOTE — Patient Instructions (Addendum)
Medication Instructions:  Your physician recommends that you continue on your current medications as directed. Please refer to the Current Medication list given to you today.  Labwork: BMET & CBC today at [facility]  Testing/Procedures: Your physician has requested that you have an exercise tolerance test. For further information please visit [email or web address]. Please also follow instruction sheet, as given.  Follow-Up: Your physician recommends that you schedule a follow-up appointment in: 3 months  Any Other Special Instructions Will Be Listed Below (If Applicable).  If you need a refill on your cardiac medications before your next appointment, please call your pharmacy.

## 2021-02-21 NOTE — Progress Notes (Signed)
Clinical Summary Ms. Scoles is a 75 y.o.femaleseen today for follow up of the following medical problems.This is a focused visit on her history of aortic stenosis.     1. Aortic stenosis - she reports a very long history of heart murmur - 08/2016 echo LVEF 65-70%, moderate AS by mean grad of 21 and AVA VTI 1.26   09/2018 echo LVEF >65%, mean grad 44 mmHg, AVA VTI 0.95, dimensionless index 0.38     Jan 2021 echo LVEF 65-70%, mod to severe AS. Mean grad 37, AVA VTI 1.10, dimensionless index 0.43   - no recent SOB/DOE, no chest pain.     02/2021 echo: LVEF 70-75%, grade II dd, normal RV, mild MR, severe AS AVA VTI 0.75 mean grad 64 DI 0.27  - no recent SOb/DOE, no chest pain - sedentary lifestyle, difficult to gauge exercise capacity    2. Afib - new diagnosis made today in clnic - palpitations about once a month, lasts a few hours.     Other chronic medical conditions not addressed this visit  2. HTN    3. Hyperlipidemia - labs followed by pcp   4. Carotid stenosis - 01/2019 carotid US RICA 4-49%, LICA 20-10% - Jan 0712 carotid US: RICA 1-97%, LICA 58-83%   5. Polycystic ovaries - on aldactone         SH: sister passed away around Thanksgiving.    Past Medical History:  Diagnosis Date   Heart murmur    Hyperlipidemia    Hypertension      Allergies  Allergen Reactions   Sulfamethoxazole-Trimethoprim Rash     Current Outpatient Medications  Medication Sig Dispense Refill   metoprolol tartrate (LOPRESSOR) 25 MG tablet TAKE (1/2) BY MOUTH TWICE DAILY 90 tablet 3   Calcium Carb-Cholecalciferol (CALCIUM 600 + D PO) Take 1 tablet by mouth 2 (two) times daily.     Cholecalciferol (VITAMIN D3) 50 MCG (2000 UT) TABS Take 1 tablet by mouth daily at 6 (six) AM.     docusate sodium (COLACE) 100 MG capsule Take 100 mg by mouth 2 (two) times daily.     Dulaglutide 1.5 MG/0.5ML SOPN Inject 2 mg into the skin once a week.     ELIQUIS 5 MG TABS tablet  TAKE (1) TABLET BY MOUTH TWICE DAILY. 60 tablet 0   losartan (COZAAR) 50 MG tablet Take 50 mg by mouth daily.     medroxyPROGESTERone (PROVERA) 2.5 MG tablet Take 2.5 mg by mouth as directed. Take one tablet by mouth daily for first 7 days of each season every 3 months     omeprazole (PRILOSEC) 20 MG capsule Take 20 mg by mouth daily as needed.     rosuvastatin (CRESTOR) 40 MG tablet Take 40 mg by mouth daily.     spironolactone (ALDACTONE) 25 MG tablet Take 25 mg by mouth as directed. 3-4 times per day     No current facility-administered medications for this visit.     Past Surgical History:  Procedure Laterality Date   COLONOSCOPY N/A 05/25/2013   Procedure: COLONOSCOPY;  Surgeon: Rogene Houston, MD;  [facility];  Service: Endoscopy;  Laterality: N/A;  [place]  may 2014   [place] Dr. [place] Crane   tooth removal  late 70's     Allergies  Allergen Reactions   Sulfamethoxazole-Trimethoprim Rash      Family History  Problem Relation Age of Onset   Stroke Mother    Heart  failure Father      Social History Ms. Shearn reports that she has never smoked. She has never used smokeless tobacco. Ms. Rasool reports no history of alcohol use.   Review of Systems CONSTITUTIONAL: No weight loss, fever, chills, weakness or fatigue.  HEENT: Eyes: No visual loss, blurred vision, double vision or yellow sclerae.No hearing loss, sneezing, congestion, runny nose or sore throat.  SKIN: No rash or itching.  CARDIOVASCULAR: per hpi RESPIRATORY: per hpi  GASTROINTESTINAL: No anorexia, nausea, vomiting or diarrhea. No abdominal pain or blood.  GENITOURINARY: No burning on urination, no polyuria NEUROLOGICAL: No headache, dizziness, syncope, paralysis, ataxia, numbness or tingling in the extremities. No change in bowel or bladder control.  MUSCULOSKELETAL: No muscle, back pain, joint pain or stiffness.  LYMPHATICS: No enlarged nodes. No history of  splenectomy.  PSYCHIATRIC: No history of depression or anxiety.  ENDOCRINOLOGIC: No reports of sweating, cold or heat intolerance. No polyuria or polydipsia.  Marland Kitchen   Physical Examination Today's Vitals   02/21/21 1016  BP: (!) 140/92  Pulse: 81  SpO2: 98%  Weight: 257 lb 3.2 oz (116.7 kg)  Height: 5' 7.5" (1.715 m)   Body mass index is 39.69 kg/m.  Gen: resting comfortably, no acute distress HEENT: no scleral icterus, pupils equal round and reactive, no palptable cervical adenopathy,  CV: RRR, 3/6 systolic murmur rusb, no jvd Resp: Clear to auscultation bilaterally GI: abdomen is soft, non-tender, non-distended, normal bowel sounds, no hepatosplenomegaly MSK: extremities are warm, no edema.  Skin: warm, no rash Neuro:  no focal deficits Psych: appropriate affect   Diagnostic Studies  08/2016 echo Study Conclusions   - Left ventricle: The cavity size was normal. Wall thickness was   increased in a pattern of mild LVH. Systolic function was   vigorous. The estimated ejection fraction was in the range of 65%   to 70%. - Aortic valve: AV ismoderately thickened, calcified with mildly   restricted motion. Peak and mean gradients through the valve are   37 and 21 mm Hg respectively.   Jan 2021 echo IMPRESSIONS      1. Left ventricular ejection fraction, by visual estimation, is 65 to 70%. The left ventricle has hyperdynamic function. There is mildly increased left ventricular hypertrophy.  2. Elevated left ventricular end-diastolic pressure.  3. The left ventricle has no regional wall motion abnormalities.  4. Diastolic dysfunction, grade indeterminate.  5. Global right ventricle has normal systolic function.The right ventricular size is normal. No increase in right ventricular wall thickness.  6. Left atrial size was normal.  7. Right atrial size was normal.  8. Mild mitral annular calcification.  9. The mitral valve is grossly normal. Trivial mitral valve  regurgitation. 10. The tricuspid valve is not well visualized. 11. The aortic valve was not well visualized. Aortic valve regurgitation is mild. Moderate to severe aortic valve stenosis. 12. Dimensionless index suggestive of moderate aortic valve stenosis but peak velocities are approaching 4 m/sec. 13. The pulmonic valve was not well visualized. Pulmonic valve regurgitation is not visualized. 14. Normal pulmonary artery systolic pressure. 15. The interatrial septum was not well visualized.   02/2021 echo IMPRESSIONS     1. Left ventricular ejection fraction, by estimation, is 70 to 75%. The  left ventricle has hyperdynamic function. The left ventricle has no  regional wall motion abnormalities. There is mild left ventricular  hypertrophy. Left ventricular diastolic  parameters are consistent with Grade II diastolic dysfunction  (pseudonormalization). Elevated left ventricular end-diastolic pressure.  2. Right ventricular systolic function is normal. The right ventricular  size is normal. There is normal pulmonary artery systolic pressure. The  estimated right ventricular systolic pressure is 66.4 mmHg.   3. The mitral valve is abnormal. Mild mitral valve regurgitation.  Moderate mitral annular calcification.   4. The aortic valve is tricuspid. There is severe calcifcation of the  aortic valve. Aortic valve regurgitation is trivial. Severe aortic valve  stenosis. Aortic valve area, by VTI measures 0.75 cm. Aortic valve Vmax  measures 4.98 m/s. Dimentionless  index 0.27.   5. The inferior vena cava is normal in size with greater than 50%  respiratory variability, suggesting right atrial pressure of 3 mmHg.     Assessment and Plan  Aortic stenosis -severe AS by recent echo - she denies any specific symptoms but is somewhat sedentary so difficult to clearly discern - plan for GXT to better evaluate her functional capacity adjusted for age and gender - if conclusive this is  symptomatic severe AS will need referal to valve clinic for TAVR consideration.       2. PAF - new diagnosis in clinic 04/09/20. At f/u 05/21/20 was back in SR - in frequent symptoms but can be long in duration, discussed she can take an additional 12.5mg  of lopressor prn as needed.  - continue eliquis.        Arnoldo Lenis, M.D.

## 2021-02-22 LAB — CBC/DIFF AMBIGUOUS DEFAULT
Basophils Absolute: 0 10*3/uL (ref 0.0–0.2)
Basos: 1 %
EOS (ABSOLUTE): 0 10*3/uL (ref 0.0–0.4)
Eos: 1 %
Hematocrit: 41.9 % (ref 34.0–46.6)
Hemoglobin: 13.8 g/dL (ref 11.1–15.9)
Immature Grans (Abs): 0 10*3/uL (ref 0.0–0.1)
Immature Granulocytes: 0 %
Lymphocytes Absolute: 2 10*3/uL (ref 0.7–3.1)
Lymphs: 23 %
MCH: 30.1 pg (ref 26.6–33.0)
MCHC: 32.9 g/dL (ref 31.5–35.7)
MCV: 92 fL (ref 79–97)
Monocytes Absolute: 0.6 10*3/uL (ref 0.1–0.9)
Monocytes: 7 %
Neutrophils Absolute: 5.8 10*3/uL (ref 1.4–7.0)
Neutrophils: 68 %
Platelets: 174 10*3/uL (ref 150–450)
RBC: 4.58 x10E6/uL (ref 3.77–5.28)
RDW: 12.6 % (ref 11.7–15.4)
WBC: 8.5 10*3/uL (ref 3.4–10.8)

## 2021-02-22 LAB — BASIC METABOLIC PANEL
BUN/Creatinine Ratio: 21 (ref 12–28)
BUN: 15 mg/dL (ref 8–27)
CO2: 23 mmol/L (ref 20–29)
Calcium: 9.2 mg/dL (ref 8.7–10.3)
Chloride: 100 mmol/L (ref 96–106)
Creatinine, Ser: 0.7 mg/dL (ref 0.57–1.00)
Glucose: 136 mg/dL — ABNORMAL HIGH (ref 70–99)
Potassium: 4.8 mmol/L (ref 3.5–5.2)
Sodium: 140 mmol/L (ref 134–144)
eGFR: 91 mL/min/{1.73_m2} (ref >59)

## 2021-03-04 ENCOUNTER — Other Ambulatory Visit: Payer: Self-pay

## 2021-03-04 ENCOUNTER — Ambulatory Visit (HOSPITAL_COMMUNITY)
Admission: RE | Admit: 2021-03-04 | Discharge: 2021-03-04 | Disposition: A | Discharge status: Home / Self Care | Payer: Medicare PPO | Source: Ambulatory Visit | Attending: Cardiology | Admitting: Cardiology

## 2021-03-04 DIAGNOSIS — I350 Nonrheumatic aortic (valve) stenosis: Secondary | ICD-10-CM | POA: Diagnosis present

## 2021-03-04 LAB — EXERCISE TOLERANCE TEST
Angina Index: 0
Estimated workload: 6
Exercise duration (min): 3 min
Exercise duration (sec): 38 s
MPHR: 146 {beats}/min
Peak HR: 133 {beats}/min
Percent HR: 91 %
RPE: 13
Rest HR: 77 {beats}/min

## 2021-03-05 ENCOUNTER — Encounter: Payer: Self-pay | Admitting: *Deleted

## 2021-03-05 ENCOUNTER — Telehealth: Payer: Self-pay | Admitting: *Deleted

## 2021-03-05 NOTE — Telephone Encounter (Signed)
Laurine Blazer, LPN  09/32/3557  3:22 AM EST Back to Top    Patient notified via letter.  Copy to pcp.     Arnoldo Lenis, MD  03/03/2021 11:17 AM EST     Normal labs   Zandra Abts MD

## 2021-03-13 ENCOUNTER — Telehealth: Payer: Self-pay | Admitting: *Deleted

## 2021-03-13 ENCOUNTER — Encounter: Payer: Self-pay | Admitting: *Deleted

## 2021-03-13 NOTE — Telephone Encounter (Signed)
Laurine Blazer, LPN  04/15/6699  1:00 AM EST Back to Top    Notified via my chart.  Copy to pcp.

## 2021-03-13 NOTE — Telephone Encounter (Signed)
-----   Message from Arnoldo Lenis, MD sent at 03/11/2021 12:30 PM EST ----- Exercise test showed her exercise capacity is roughly what would be expected based on age and gender. Continue to monitor very closely for any new symptoms of chest pain or shorntess of breath that would indicate we need to intervene on her valve. Keep appt with me in March  Carlyle Dolly MD

## 2021-03-24 ENCOUNTER — Other Ambulatory Visit: Payer: Self-pay | Admitting: Cardiology

## 2021-03-25 NOTE — Telephone Encounter (Signed)
Prescription refill request for Eliquis received. Indication: Atrial Fib Last office visit: 02/21/21  Zandra Abts MD Scr: 0.70 on 02/21/21 Age: 76 Weight: 116.7kg  Based on above findings Eliquis 5mg  twice daily is the appropriate dose.  Refill approved.

## 2021-04-09 ENCOUNTER — Encounter (INDEPENDENT_AMBULATORY_CARE_PROVIDER_SITE_OTHER): Payer: Self-pay

## 2021-04-14 ENCOUNTER — Other Ambulatory Visit: Payer: Self-pay

## 2021-04-14 ENCOUNTER — Encounter (INDEPENDENT_AMBULATORY_CARE_PROVIDER_SITE_OTHER): Payer: Self-pay | Admitting: Gastroenterology

## 2021-04-14 ENCOUNTER — Ambulatory Visit (INDEPENDENT_AMBULATORY_CARE_PROVIDER_SITE_OTHER): Payer: Medicare PPO | Admitting: Gastroenterology

## 2021-04-14 VITALS — BP 154/75 | HR 76 | Temp 98.6°F | Ht 67.5 in | Wt 256.2 lb

## 2021-04-14 DIAGNOSIS — R194 Change in bowel habit: Secondary | ICD-10-CM

## 2021-04-14 DIAGNOSIS — R195 Other fecal abnormalities: Secondary | ICD-10-CM

## 2021-04-14 DIAGNOSIS — K589 Irritable bowel syndrome without diarrhea: Secondary | ICD-10-CM | POA: Insufficient documentation

## 2021-04-14 NOTE — Progress Notes (Signed)
Referring Provider: Denyce Robert, FNP Primary Care Physician:  Denyce Robert, FNP Primary GI Physician: new  Chief Complaint  Patient presents with   Diarrhea    Patient had constipation for years and last fall it switched to diarrhea. Takes imodium about once a week for diarrhea. Takes  probiotics and yogurt daily.    HPI:   Jennifer Cochran is a 76 y.o. female with past medical history of HLD, HTN.  Patient presenting today as new patient for diarrhea.  Patient states that back in the summer she began having diarrhea, she saw PCP who did labs and stool culture which was all normal. She states that in September and October this was worse, she is still having some diarrhea but this has improved some, she is taking imodium maybe once per week. Was previously taking pepto bismol. She is now having maybe 1 BM per day that is soft but formed. She reports that she will have maybe 1-2 episodes of watery diarrhea per week now. Initially when symptoms began she was having two episodes of watery diarrhea per day. She reports that she had a dental implant last year and had to take amoxicillin in July and august. She feels that symptoms really flared up after this. She states that she was previously on trulicity for a few years, recently had to start ozempic instead about 5-6 weeks ago as trulicity is on backorder currently. Previously she had constipation that she had to take up to 4 stool softeners per day for. Denies any abdominal pain but does have a lot of "bubbling and churning" and reports that she has fecal urgency at times as well. Had one accident recently but denies any others or any episodes of fecal incontinence.  She denies any illnesses prior to onset of diarrhea. She denies any melena or rectal bleeding. Denies nausea, vomiting, fevers, chills. She denies any changes in appetite or weight loss. She cannot seem to find a correlation between what she is eating and her symptoms.    NSAID use: very occasional Social hx: no etoh or tobacco Fam hx: no crc, liver disease or GI disorders.   Last Colonoscopy:03/31/13 prep excellent, small polyp in ascending colon, few small scatter diverticula at sigmoid colon, normal rectal mucosa, small hemorrhoids below dentate line Last Endoscopy:n/a   Past Medical History:  Diagnosis Date   Heart murmur    Hyperlipidemia    Hypertension     Past Surgical History:  Procedure Laterality Date   COLONOSCOPY N/A 05/25/2013   Procedure: COLONOSCOPY;  Surgeon: Rogene Houston, MD;  Location[facility];  Service: Endoscopy;  Laterality: N/A;  [place]  may 2014   [place] Dr. [place] Crane   tooth removal  late 70's    Current Outpatient Medications  Medication Sig Dispense Refill   apixaban (ELIQUIS) 5 MG TABS tablet TAKE (1) TABLET BY MOUTH TWICE DAILY. 60 tablet 5   Calcium Carb-Cholecalciferol (CALCIUM 600 + D PO) Take 1 tablet by mouth 2 (two) times daily.     Cholecalciferol (VITAMIN D3) 50 MCG (2000 UT) TABS Take 1 tablet by mouth daily at 6 (six) AM.     losartan (COZAAR) 50 MG tablet Take 50 mg by mouth daily.     medroxyPROGESTERone (PROVERA) 2.5 MG tablet Take 2.5 mg by mouth as directed. Take one tablet by mouth daily for first 7 days of each season every 3 months     metoprolol tartrate (LOPRESSOR) 25 MG tablet TAKE (  1/2) BY MOUTH TWICE DAILY 90 tablet 3   omeprazole (PRILOSEC) 20 MG capsule Take 20 mg by mouth daily as needed.     rosuvastatin (CRESTOR) 40 MG tablet Take 40 mg by mouth daily.     Semaglutide (OZEMPIC, 0.25 OR 0.5 MG/DOSE, [place]) Inject into the skin. 0.5mg  once weekly ( on this until trulicity comes back on market)     spironolactone (ALDACTONE) 25 MG tablet Take 25 mg by mouth as directed. 2 per day     Dulaglutide 1.5 MG/0.5ML SOPN Inject 2 mg into the skin once a week. (Patient not taking: Reported on 04/14/2021)     No current facility-administered medications for this visit.     Allergies as of 04/14/2021 - Review Complete 04/14/2021  Allergen Reaction Noted   Sulfamethoxazole-trimethoprim Rash 01/31/2020    Family History  Problem Relation Age of Onset   Stroke Mother    Heart failure Father     Social History   Socioeconomic History   Marital status: Widowed    Spouse name: Not on file   Number of children: Not on file   Years of education: Not on file   Highest education level: Not on file  Occupational History   Not on file  Tobacco Use   Smoking status: Never   Smokeless tobacco: Never  Vaping Use   Vaping Use: Never used  Substance and Sexual Activity   Alcohol use: No   Drug use: No   Sexual activity: Not on file  Other Topics Concern   Not on file  Social History Narrative   Not on file   Social Determinants of Health   Financial Resource Strain: Not on file  Food Insecurity: Not on file  Transportation Needs: Not on file  Physical Activity: Not on file  Stress: Not on file  Social Connections: Not on file   Review of systems General: negative for malaise, night sweats, fever, chills, weight los Neck: Negative for lumps, goiter, pain and significant neck swelling Resp: Negative for cough, wheezing, dyspnea at rest CV: Negative for chest pain, leg swelling, palpitations, orthopnea GI: denies melena, hematochezia, nausea, vomiting,  constipation, dysphagia, odyonophagia, early satiety or unintentional weight loss. +diarrhea +abdominal gurgling/bubbling MSK: Negative for joint pain or swelling, back pain, and muscle pain. Derm: Negative for itching or rash Psych: Denies depression, anxiety, memory loss, confusion. No homicidal or suicidal ideation.  Heme: Negative for prolonged bleeding, bruising easily, and swollen nodes. Endocrine: Negative for cold or heat intolerance, polyuria, polydipsia and goiter. Neuro: negative for tremor, gait imbalance, syncope and seizures. The remainder of the review of systems is  noncontributory.  Physical Exam: BP (!) 154/75 (BP Location: Left Arm, Patient Position: Sitting, Cuff Size: Large)    Pulse 76    Temp 98.6 F (37 C) (Oral)    Ht 5' 7.5" (1.715 m)    Wt 256 lb 3.2 oz (116.2 kg)    BMI 39.53 kg/m  General:   Alert and oriented. No distress noted. Pleasant and cooperative.  Head:  Normocephalic and atraumatic. Eyes:  Conjuctiva clear without scleral icterus. Mouth:  Oral mucosa pink and moist. Good dentition. No lesions. Heart: normal rate, rhythm, murmur present Lungs: Clear lung sounds in all lobes. Respirations equal and unlabored. Abdomen:  +BS, soft, non-tender and non-distended. No rebound or guarding. No HSM or masses noted. Derm: No palmar erythema or jaundice Msk:  Symmetrical without gross deformities. Normal posture. Extremities:  Without edema. Neurologic:  Alert and  oriented  x4 Psych:  Alert and cooperative. Normal mood and affect.  Invalid input(s): 6 MONTHS   ASSESSMENT/Plan: GISELA LEA is a 76 y.o. female presenting today for diarrhea and stomach gurgling.  Previously had constipation, now having softer stools with diarrhea occurring maybe once per week after acute onset of diarrhea in August. 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 It is reassuring that diarrhea has become less frequent and patient is having no alarm symptoms. She should continue probiotic as this has provided some improvement in symptoms. Patient to keep a food journal for 2 weeks to establish if there are any correlations between symptoms and things you are eating. I also provided the low FODMAP diet which helps you avoid certain foods/additives that can sometimes worsen IBS. At this time, I have a low suspicion for active infection, given how infrequent  diarrhea is, and do not feel that endoscopic  evaluation is warranted given lack of alarm symptoms, however, if pt develops more frequent diarrhea, abdominal pain, rectal bleeding or other new GI symptoms, she will let me know. Patient advised to call with progress report in 1 month. I discussed implementation of anti spasmodic to see if this helped with the gurgling/bubbling sensation she is experiencing, however, She does not want to do bentyl at this time as she does not feel that she needs it, can continue using imodium on PRN bases. She will let me know if she decides she would like to try an anti spasmodic for her symptoms. Pt verbalized understanding, all questions were answered and she was in agreement with plan as laid out above.   Follow Up: 6 months  Allysen Lazo L. Alver Sorrow, MSN, APRN, AGNP-C Adult-Gerontology Nurse Practitioner [facility]

## 2021-04-14 NOTE — Patient Instructions (Signed)
It was very nice to meet you today! I suspect you are experiencing some irritable bowel syndrome, it is possible you had an infection initially that left you with a post infectious irritable bowel. It is reassuring that diarrhea has become less frequent and you are having no blood in your stools, abdominal pain or weight loss. Please continue your probiotic. I will put a recommendation of one I really like that you can try once you finish your current one or if you are satisfied with your current one, you can continue that. Please keep a food journal for 2 weeks to establish if there are any correlations between symptoms and things you are eating. I am also providing the low FODMAP diet which helps you avoid certain foods/additives that can sometimes worsen IBS.  At this time, I have a low suspicion for active infection, given how infrequent you diarrhea is, however, if you develop more frequent diarrhea, abdominal pain, or other new GI symptoms, please let me know. Please call in 1 month with a progress report on how you are doing. If you decide you would like to try the anti spasmodic medication to help with the looser stools, I will be happy to send this in for you.  Culturelle daily digestive or other probiotics products that contain lactobacillus rhamnosus   Follow up 6 months

## 2021-04-15 DIAGNOSIS — R195 Other fecal abnormalities: Secondary | ICD-10-CM | POA: Insufficient documentation

## 2021-04-15 DIAGNOSIS — R194 Change in bowel habit: Secondary | ICD-10-CM | POA: Insufficient documentation

## 2021-05-01 ENCOUNTER — Telehealth (INDEPENDENT_AMBULATORY_CARE_PROVIDER_SITE_OTHER): Payer: Self-pay | Admitting: *Deleted

## 2021-05-01 NOTE — Telephone Encounter (Signed)
Pt states she thinks glucerna was causing her diarrhea. She increased the shakes while trying to cut certain foods out of diet and diarrhea increased. She stopped shakes and has not had diarrhea since stopping shakes on Sunday. Pt states she would like to cancel appt in august if chelsea thinks its ok.

## 2021-05-01 NOTE — Telephone Encounter (Signed)
Patient made aware of all. Mitzie please cancel the upcoming appointment with [place] please.

## 2021-05-01 NOTE — Telephone Encounter (Signed)
Pt left vm that she was asked to call back with update. Seen 2/6.  Tried to call pt - number busy. Will try again later today.   [phone]

## 2021-05-28 ENCOUNTER — Ambulatory Visit: Payer: Medicare PPO | Admitting: Cardiology

## 2021-05-28 ENCOUNTER — Encounter: Payer: Self-pay | Admitting: Cardiology

## 2021-05-28 ENCOUNTER — Other Ambulatory Visit: Payer: Self-pay

## 2021-05-28 VITALS — BP 144/80 | HR 72 | Ht 67.5 in | Wt 255.4 lb

## 2021-05-28 DIAGNOSIS — I4891 Unspecified atrial fibrillation: Secondary | ICD-10-CM

## 2021-05-28 DIAGNOSIS — I10 Essential (primary) hypertension: Secondary | ICD-10-CM

## 2021-05-28 DIAGNOSIS — D6869 Other thrombophilia: Secondary | ICD-10-CM | POA: Diagnosis not present

## 2021-05-28 DIAGNOSIS — I350 Nonrheumatic aortic (valve) stenosis: Secondary | ICD-10-CM

## 2021-05-28 NOTE — Patient Instructions (Signed)
Medication Instructions:  ?Your physician recommends that you continue on your current medications as directed. Please refer to the Current Medication list given to you today.  ? ?Labwork: ?none ? ?Testing/Procedures: ?Your physician has requested that you have an echocardiogram. Echocardiography is a painless test that uses sound waves to create images of your heart. It provides your doctor with information about the size and shape of your heart and how well your heart?s chambers and valves are working. This procedure takes approximately one hour. There are no restrictions for this procedure.  ? ?Your physician has requested that you have a carotid duplex. This test is an ultrasound of the carotid arteries in your neck. It looks at blood flow through these arteries that supply the brain with blood. Allow one hour for this exam. There are no restrictions or special instructions.  ? ?Follow-Up: ?Your physician recommends that you schedule a follow-up appointment in: 4 months ? ?Any Other Special Instructions Will Be Listed Below (If Applicable). ? ?If you need a refill on your cardiac medications before your next appointment, please call your pharmacy. ? ?

## 2021-05-28 NOTE — Progress Notes (Signed)
? ? ? ?Clinical Summary ?Jennifer Cochran is a 76 y.o.female ? ?1. Aortic stenosis ?- she reports a very long history of heart murmur ?- 08/2016 echo LVEF 65-70%, moderate AS by mean grad of 21 and AVA VTI 1.26 ?  ?09/2018 echo LVEF >65%, mean grad 44 mmHg, AVA VTI 0.95, dimensionless index 0.38 ?  ?  ?Jan 2021 echo LVEF 65-70%, mod to severe AS. Mean grad 37, AVA VTI 1.10, dimensionless index 0.43 ?  ?- no recent SOB/DOE, no chest pain.  ?  ?  ?02/2021 echo: LVEF 70-75%, grade II dd, normal RV, mild MR, severe AS AVA VTI 0.75 mean grad 64 DI 0.27 ?  ?- no recent SOb/DOE, no chest pain ?- sedentary lifestyle, difficult to gauge exercise capacity ? -02/2021 GXT exercise capacity roughly what would be expected for age and gender.  ?  ?- denies any recent symptoms ? ?  ?2. Afib ?- palpitations about once a month ?- no bleeding on eliquis ? ?  ?3.  HTN ?-compliant with meds ?- few times a week checks, usually around 120s/70s ?  ?  ?4. Hyperlipidemia ?- reports recent labs with pcp ?  ?5. Carotid stenosis ?- 01/2019 carotid US RICA 0-98%, LICA 11-91% ?- Jan 2022 carotid US: RICA 4-78%, LICA 29-56% ?- no symptoms ?  ?6. Polycystic ovaries ?- on aldactone ? ?  ?  ?Past Medical History:  ?Diagnosis Date  ? Heart murmur   ? Hyperlipidemia   ? Hypertension   ? ? ? ?Allergies  ?Allergen Reactions  ? Sulfamethoxazole-Trimethoprim Rash  ? ? ? ?Current Outpatient Medications  ?Medication Sig Dispense Refill  ? apixaban (ELIQUIS) 5 MG TABS tablet TAKE (1) TABLET BY MOUTH TWICE DAILY. 60 tablet 5  ? Calcium Carb-Cholecalciferol (CALCIUM 600 + D PO) Take 1 tablet by mouth 2 (two) times daily.    ? Cholecalciferol (VITAMIN D3) 50 MCG (2000 UT) TABS Take 1 tablet by mouth daily at 6 (six) AM.    ? Dulaglutide 1.5 MG/0.5ML SOPN Inject 2 mg into the skin once a week. (Patient not taking: Reported on 04/14/2021)    ? losartan (COZAAR) 50 MG tablet Take 50 mg by mouth daily.    ? medroxyPROGESTERone (PROVERA) 2.5 MG tablet Take 2.5 mg by  mouth as directed. Take one tablet by mouth daily for first 7 days of each season every 3 months    ? metoprolol tartrate (LOPRESSOR) 25 MG tablet TAKE (1/2) BY MOUTH TWICE DAILY 90 tablet 3  ? omeprazole (PRILOSEC) 20 MG capsule Take 20 mg by mouth daily as needed.    ? rosuvastatin (CRESTOR) 40 MG tablet Take 40 mg by mouth daily.    ? Semaglutide (OZEMPIC, 0.25 OR 0.5 MG/DOSE, [place]) Inject into the skin. 0.'5mg'$  once weekly ( on this until trulicity comes back on market)    ? spironolactone (ALDACTONE) 25 MG tablet Take 25 mg by mouth as directed. 2 per day    ? ?No current facility-administered medications for this visit.  ? ? ? ?Past Surgical History:  ?Procedure Laterality Date  ? COLONOSCOPY N/A 05/25/2013  ? Procedure: COLONOSCOPY;  Surgeon: Rogene Houston, MD;  Location: [facility];  Service: Endoscopy;  Laterality: N/A;  1030  ? MELANOMA EXCISION  may 2014  ? [place] [address] [place]  ? tooth removal  late 70's  ? ? ? ?Allergies  ?Allergen Reactions  ? Sulfamethoxazole-Trimethoprim Rash  ? ? ? ? ?Family History  ?Problem Relation Age of Onset  ?  Stroke Mother   ? Heart failure Father   ? ? ? ?Social History ?Ms. Marcoe reports that she has never smoked. She has never used smokeless tobacco. ?Ms. Moretto reports no history of alcohol use. ? ? ?Review of Systems ?CONSTITUTIONAL: No weight loss, fever, chills, weakness or fatigue.  ?HEENT: Eyes: No visual loss, blurred vision, double vision or yellow sclerae.No hearing loss, sneezing, congestion, runny nose or sore throat.  ?SKIN: No rash or itching.  ?CARDIOVASCULAR: per hpi ?RESPIRATORY: No shortness of breath, cough or sputum.  ?GASTROINTESTINAL: No anorexia, nausea, vomiting or diarrhea. No abdominal pain or blood.  ?GENITOURINARY: No burning on urination, no polyuria ?NEUROLOGICAL: No headache, dizziness, syncope, paralysis, ataxia, numbness or tingling in the extremities. No change in bowel or bladder control.  ?MUSCULOSKELETAL: No muscle, back pain,  joint pain or stiffness.  ?LYMPHATICS: No enlarged nodes. No history of splenectomy.  ?PSYCHIATRIC: No history of depression or anxiety.  ?ENDOCRINOLOGIC: No reports of sweating, cold or heat intolerance. No polyuria or polydipsia.  ?. ? ? ?Physical Examination ?Today's Vitals  ? 05/28/21 0852  ?BP: (!) 144/80  ?Pulse: 72  ?Weight: 255 lb 6.4 oz (115.8 kg)  ?Height: 5' 7.5" (1.715 m)  ? ?Body mass index is 39.41 kg/m?. ? ?Gen: resting comfortably, no acute distress ?HEENT: no scleral icterus, pupils equal round and reactive, no palptable cervical adenopathy,  ?CV: RRR, 3/6 systolic murmur rusb, +bilateral carotid bruits ?Resp: Clear to auscultation bilaterally ?GI: abdomen is soft, non-tender, non-distended, normal bowel sounds, no hepatosplenomegaly ?MSK: extremities are warm, no edema.  ?Skin: warm, no rash ?Neuro:  no focal deficits ?Psych: appropriate affect ? ? ?Diagnostic Studies ?08/2016 echo ?Study Conclusions ?  ?- Left ventricle: The cavity size was normal. Wall thickness was ?  increased in a pattern of mild LVH. Systolic function was ?  vigorous. The estimated ejection fraction was in the range of 65% ?  to 70%. ?- Aortic valve: AV ismoderately thickened, calcified with mildly ?  restricted motion. Peak and mean gradients through the valve are ?  37 and 21 mm Hg respectively. ?  ?Jan 2021 echo ?IMPRESSIONS ?  ?  ? 1. Left ventricular ejection fraction, by visual estimation, is 65 to 70%. The left ventricle has hyperdynamic function. There is mildly increased left ventricular hypertrophy. ? 2. Elevated left ventricular end-diastolic pressure. ? 3. The left ventricle has no regional wall motion abnormalities. ? 4. Diastolic dysfunction, grade indeterminate. ? 5. Global right ventricle has normal systolic function.The right ventricular size is normal. No increase in right ventricular wall thickness. ? 6. Left atrial size was normal. ? 7. Right atrial size was normal. ? 8. Mild mitral annular calcification. ?  9. The mitral valve is grossly normal. Trivial mitral valve regurgitation. ?10. The tricuspid valve is not well visualized. ?11. The aortic valve was not well visualized. Aortic valve regurgitation is mild. Moderate to severe aortic valve stenosis. ?12. Dimensionless index suggestive of moderate aortic valve stenosis but peak velocities are approaching 4 m/sec. ?13. The pulmonic valve was not well visualized. Pulmonic valve regurgitation is not visualized. ?14. Normal pulmonary artery systolic pressure. ?15. The interatrial septum was not well visualized. ?  ?  ?02/2021 echo ?IMPRESSIONS  ? ? ? 1. Left ventricular ejection fraction, by estimation, is 70 to 75%. The  ?left ventricle has hyperdynamic function. The left ventricle has no  ?regional wall motion abnormalities. There is mild left ventricular  ?hypertrophy. Left ventricular diastolic  ?parameters are consistent with Grade II  diastolic dysfunction  ?(pseudonormalization). Elevated left ventricular end-diastolic pressure.  ? 2. Right ventricular systolic function is normal. The right ventricular  ?size is normal. There is normal pulmonary artery systolic pressure. The  ?estimated right ventricular systolic pressure is 23.3 mmHg.  ? 3. The mitral valve is abnormal. Mild mitral valve regurgitation.  ?Moderate mitral annular calcification.  ? 4. The aortic valve is tricuspid. There is severe calcifcation of the  ?aortic valve. Aortic valve regurgitation is trivial. Severe aortic valve  ?stenosis. Aortic valve area, by VTI measures 0.75 cm?Marland Kitchen Aortic valve Vmax  ?measures 4.98 m/s. Dimentionless  ?index 0.27.  ? 5. The inferior vena cava is normal in size with greater than 50%  ?respiratory variability, suggesting right atrial pressure of 3 mmHg.  ? ? ?02/2021 GXT ?  up sloping ST depression in the inferolateral leads (II, III and aVF) was noted. ?  Prior study not available for comparison. ?  ?Negative adequate stress test without evidence of ischemia at given  workload. Moderately impaired exercise tolerance. ? ? ?Assessment and Plan  ?Aortic stenosis ?-severe AS by recent echo ?- no symptoms, recent GXT exercise capacity was reasonable based on age and gender ?

## 2021-06-09 ENCOUNTER — Ambulatory Visit (INDEPENDENT_AMBULATORY_CARE_PROVIDER_SITE_OTHER): Payer: Medicare PPO | Admitting: Gastroenterology

## 2021-06-26 ENCOUNTER — Ambulatory Visit (INDEPENDENT_AMBULATORY_CARE_PROVIDER_SITE_OTHER): Payer: Medicare PPO

## 2021-06-26 DIAGNOSIS — I6523 Occlusion and stenosis of bilateral carotid arteries: Secondary | ICD-10-CM | POA: Diagnosis not present

## 2021-06-30 ENCOUNTER — Telehealth: Payer: Self-pay

## 2021-06-30 NOTE — Telephone Encounter (Signed)
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 ?Zandra Abts MD ?

## 2021-06-30 NOTE — Telephone Encounter (Signed)
Patient notified and verbalized understanding. Pt had no questions or concerns at this time 

## 2021-07-23 ENCOUNTER — Other Ambulatory Visit (HOSPITAL_COMMUNITY): Payer: Self-pay | Admitting: Cardiology

## 2021-07-23 DIAGNOSIS — I6523 Occlusion and stenosis of bilateral carotid arteries: Secondary | ICD-10-CM

## 2021-08-14 ENCOUNTER — Ambulatory Visit (INDEPENDENT_AMBULATORY_CARE_PROVIDER_SITE_OTHER): Payer: Medicare PPO

## 2021-08-14 DIAGNOSIS — I350 Nonrheumatic aortic (valve) stenosis: Secondary | ICD-10-CM

## 2021-08-14 LAB — ECHOCARDIOGRAM COMPLETE
AR max vel: 0.69 cm2
AV Area VTI: 0.74 cm2
AV Area mean vel: 0.72 cm2
AV Mean grad: 51.5 mmHg
AV Peak grad: 83.1 mmHg
Ao pk vel: 4.56 m/s
Area-P 1/2: 2.43 cm2
Calc EF: 77.7 %
MV M vel: 3.41 m/s
MV Peak grad: 46.5 mmHg
P 1/2 time: 1160 msec
S' Lateral: 2.83 cm
Single Plane A2C EF: 81.5 %
Single Plane A4C EF: 69.3 %

## 2021-09-15 ENCOUNTER — Other Ambulatory Visit: Payer: Self-pay | Admitting: Cardiology

## 2021-09-23 ENCOUNTER — Other Ambulatory Visit: Payer: Self-pay | Admitting: Cardiology

## 2021-09-23 NOTE — Telephone Encounter (Signed)
Prescription refill request for Eliquis received. Indication: PAF Last office visit: 05/28/21  Zandra Abts MD Scr: 0.81 on 05/07/21 Age: 76 Weight:115.8kg  Based on above findings Eliquis '5mg'$  twice daily is the appropriate dose.  Refill approved.

## 2021-09-29 ENCOUNTER — Encounter: Payer: Self-pay | Admitting: Cardiology

## 2021-09-29 ENCOUNTER — Ambulatory Visit: Payer: Medicare PPO | Admitting: Cardiology

## 2021-09-29 VITALS — BP 120/70 | HR 70 | Ht 67.5 in | Wt 256.2 lb

## 2021-09-29 DIAGNOSIS — D6869 Other thrombophilia: Secondary | ICD-10-CM

## 2021-09-29 DIAGNOSIS — I350 Nonrheumatic aortic (valve) stenosis: Secondary | ICD-10-CM | POA: Diagnosis not present

## 2021-09-29 DIAGNOSIS — I4891 Unspecified atrial fibrillation: Secondary | ICD-10-CM

## 2021-09-29 DIAGNOSIS — I6523 Occlusion and stenosis of bilateral carotid arteries: Secondary | ICD-10-CM | POA: Diagnosis not present

## 2021-09-29 DIAGNOSIS — I10 Essential (primary) hypertension: Secondary | ICD-10-CM

## 2021-09-29 NOTE — Patient Instructions (Signed)

## 2021-09-29 NOTE — Progress Notes (Signed)
Clinical Summary Ms. Carbonell is a 76 y.o.female seen today for follow up of the following medical problems.   1. Aortic stenosis - she reports a very long history of heart murmur - 08/2016 echo LVEF 65-70%, moderate AS by mean grad of 21 and AVA VTI 1.26   09/2018 echo LVEF >65%, mean grad 44 mmHg, AVA VTI 0.95, dimensionless index 0.38  Jan 2021 echo LVEF 65-70%, mod to severe AS. Mean grad 37, AVA VTI 1.10, dimensionless index 0.43 02/2021 echo: LVEF 70-75%, grade II dd, normal RV, mild MR, severe AS AVA VTI 0.75 mean grad 64 DI 0.27    -02/2021 GXT exercise capacity roughly what would be expected for age and gender.  - 08/2021 echo: LVEF 70-75%, mean grade 52 mmHg, AVA VTI 0.74, DI 0.26 - in spring walking on treadmill up to 1/2 mile   2. Afib - no palpitations. No bleeding on eliquis   3.  HTN -compliant with meds     4. Hyperlipidemia - 05/2021 TC 159 TG 136 HDL 40 LDL 95     5. Carotid stenosis -11/5091 RICA 2-67%, LICA 12-45% - no neuro symptoms    6. Polycystic ovaries - on aldactone    7. DM2 - followed by pcp   Past Medical History:  Diagnosis Date   Heart murmur    Hyperlipidemia    Hypertension      Allergies  Allergen Reactions   Sulfamethoxazole-Trimethoprim Rash     Current Outpatient Medications  Medication Sig Dispense Refill   apixaban (ELIQUIS) 5 MG TABS tablet TAKE (1) TABLET BY MOUTH TWICE DAILY. 60 tablet 5   Calcium Carb-Cholecalciferol (CALCIUM 600 + D PO) Take 1 tablet by mouth 2 (two) times daily.     Lactobacillus-Inulin (CULTURELLE ULTIMATE STRENGTH) CAPS Take 1 capsule by mouth 2 (two) times daily.     losartan (COZAAR) 50 MG tablet Take 50 mg by mouth daily.     medroxyPROGESTERone (PROVERA) 2.5 MG tablet Take 2.5 mg by mouth as directed. Take one tablet by mouth daily for first 7 days of each season every 3 months     metoprolol tartrate (LOPRESSOR) 25 MG tablet TAKE (1/2) BY MOUTH TWICE DAILY 90 tablet 2   omeprazole  (PRILOSEC) 20 MG capsule Take 20 mg by mouth daily as needed.     rosuvastatin (CRESTOR) 40 MG tablet Take 40 mg by mouth daily.     Semaglutide (OZEMPIC, 0.25 OR 0.5 MG/DOSE, [place]) Inject into the skin. 0.'5mg'$  once weekly ( on this until trulicity comes back on market)     spironolactone (ALDACTONE) 25 MG tablet Take 25 mg by mouth 2 (two) times daily.     No current facility-administered medications for this visit.     Past Surgical History:  Procedure Laterality Date   COLONOSCOPY N/A 05/25/2013   Procedure: COLONOSCOPY;  Surgeon: Rogene Houston, MD;  Location: [facility];  Service: Endoscopy;  Laterality: N/A;  [place]  may 2014   [place] [address]. [place]   tooth removal  late 70's     Allergies  Allergen Reactions   Sulfamethoxazole-Trimethoprim Rash      Family History  Problem Relation Age of Onset   Stroke Mother    Heart failure Father      Social History Ms. Sneed reports that she has never smoked. She has never used smokeless tobacco. Ms. Mesmer reports no history of alcohol use.   Review of Systems CONSTITUTIONAL: No weight  loss, fever, chills, weakness or fatigue.  HEENT: Eyes: No visual loss, blurred vision, double vision or yellow sclerae.No hearing loss, sneezing, congestion, runny nose or sore throat.  SKIN: No rash or itching.  CARDIOVASCULAR: per hpi RESPIRATORY: No shortness of breath, cough or sputum.  GASTROINTESTINAL: No anorexia, nausea, vomiting or diarrhea. No abdominal pain or blood.  GENITOURINARY: No burning on urination, no polyuria NEUROLOGICAL: No headache, dizziness, syncope, paralysis, ataxia, numbness or tingling in the extremities. No change in bowel or bladder control.  MUSCULOSKELETAL: No muscle, back pain, joint pain or stiffness.  LYMPHATICS: No enlarged nodes. No history of splenectomy.  PSYCHIATRIC: No history of depression or anxiety.  ENDOCRINOLOGIC: No reports of sweating, cold or heat intolerance.  No polyuria or polydipsia.  Marland Kitchen   Physical Examination Today's Vitals   09/29/21 1124  BP: 120/70  Pulse: 70  SpO2: 99%  Weight: 256 lb 3.2 oz (116.2 kg)  Height: 5' 7.5" (1.715 m)   Body mass index is 39.53 kg/m.  Gen: resting comfortably, no acute distress HEENT: no scleral icterus, pupils equal round and reactive, no palptable cervical adenopathy,  CV: RRR, 3/6 systolic murmur rusb no jvd Resp: Clear to auscultation bilaterally GI: abdomen is soft, non-tender, non-distended, normal bowel sounds, no hepatosplenomegaly MSK: extremities are warm, no edema.  Skin: warm, no rash Neuro:  no focal deficits Psych: appropriate affect   Diagnostic Studies  08/2016 echo Study Conclusions   - Left ventricle: The cavity size was normal. Wall thickness was   increased in a pattern of mild LVH. Systolic function was   vigorous. The estimated ejection fraction was in the range of 65%   to 70%. - Aortic valve: AV ismoderately thickened, calcified with mildly   restricted motion. Peak and mean gradients through the valve are   37 and 21 mm Hg respectively.   Jan 2021 echo IMPRESSIONS      1. Left ventricular ejection fraction, by visual estimation, is 65 to 70%. The left ventricle has hyperdynamic function. There is mildly increased left ventricular hypertrophy.  2. Elevated left ventricular end-diastolic pressure.  3. The left ventricle has no regional wall motion abnormalities.  4. Diastolic dysfunction, grade indeterminate.  5. Global right ventricle has normal systolic function.The right ventricular size is normal. No increase in right ventricular wall thickness.  6. Left atrial size was normal.  7. Right atrial size was normal.  8. Mild mitral annular calcification.  9. The mitral valve is grossly normal. Trivial mitral valve regurgitation. 10. The tricuspid valve is not well visualized. 11. The aortic valve was not well visualized. Aortic valve regurgitation is mild.  Moderate to severe aortic valve stenosis. 12. Dimensionless index suggestive of moderate aortic valve stenosis but peak velocities are approaching 4 m/sec. 13. The pulmonic valve was not well visualized. Pulmonic valve regurgitation is not visualized. 14. Normal pulmonary artery systolic pressure. 15. The interatrial septum was not well visualized.     02/2021 echo IMPRESSIONS     1. Left ventricular ejection fraction, by estimation, is 70 to 75%. The  left ventricle has hyperdynamic function. The left ventricle has no  regional wall motion abnormalities. There is mild left ventricular  hypertrophy. Left ventricular diastolic  parameters are consistent with Grade II diastolic dysfunction  (pseudonormalization). Elevated left ventricular end-diastolic pressure.   2. Right ventricular systolic function is normal. The right ventricular  size is normal. There is normal pulmonary artery systolic pressure. The  estimated right ventricular systolic pressure is 02.7 mmHg.  3. The mitral valve is abnormal. Mild mitral valve regurgitation.  Moderate mitral annular calcification.   4. The aortic valve is tricuspid. There is severe calcifcation of the  aortic valve. Aortic valve regurgitation is trivial. Severe aortic valve  stenosis. Aortic valve area, by VTI measures 0.75 cm. Aortic valve Vmax  measures 4.98 m/s. Dimentionless  index 0.27.   5. The inferior vena cava is normal in size with greater than 50%  respiratory variability, suggesting right atrial pressure of 3 mmHg.      02/2021 GXT   up sloping ST depression in the inferolateral leads (II, III and aVF) was noted.   Prior study not available for comparison.   Negative adequate stress test without evidence of ischemia at given workload. Moderately impaired exercise tolerance.   08/2021 echo IMPRESSIONS     1. Left ventricular ejection fraction, by estimation, is 70 to 75%. The  left ventricle has hyperdynamic function. The  left ventricle has no  regional wall motion abnormalities. There is moderate left ventricular  hypertrophy. Left ventricular diastolic  parameters are consistent with Grade II diastolic dysfunction  (pseudonormalization). Elevated left atrial pressure. The average left  ventricular global longitudinal strain is -18.5 %. The global longitudinal  strain is normal.   2. Right ventricular systolic function is normal. The right ventricular  size is normal. Tricuspid regurgitation signal is inadequate for assessing  PA pressure.   3. The mitral valve is normal in structure. Mild mitral valve  regurgitation. Mild mitral stenosis. Moderate mitral annular  calcification.   4. The aortic valve is tricuspid. There is moderate calcification of the  aortic valve. There is moderate thickening of the aortic valve. Aortic  valve regurgitation is not visualized. Severe aortic valve stenosis.  Aortic valve mean gradient measures 51.5   mmHg. Aortic valve peak gradient measures 83.1 mmHg. Aortic valve area,  by VTI measures 0.74 cm.    Assessment and Plan  Aortic stenosis -severe AS by echo - 02/2021 recent GXT exercise capacity was reasonable based on age and gender - continues to be asymptoamtic, continue to watch closely. At onset of symptoms or if evidence of LVEF decline would need referal to structural heart clinic       2. PAF/acquired thormbophilia -no symptoms, continue current meds including eliquis for stroke prevention.      3. Carotid stenosis - asymptomatic mild to moderate stenosis - we will continue to monitor   4. HTN - at goal, continue current meds          Arnoldo Lenis, M.D.

## 2021-10-13 ENCOUNTER — Ambulatory Visit (INDEPENDENT_AMBULATORY_CARE_PROVIDER_SITE_OTHER): Payer: Medicare PPO | Admitting: Gastroenterology

## 2022-03-05 ENCOUNTER — Other Ambulatory Visit: Payer: Self-pay | Admitting: Cardiology

## 2022-03-05 NOTE — Telephone Encounter (Signed)
Prescription refill request for Eliquis received.  Indication: afib  Last office visit: [place], 09/29/2021 Scr: 0.83, 01/16/2022 Age: 76 yo  Weight: 116.2kg  Refill sent.

## 2022-04-07 ENCOUNTER — Encounter: Payer: Self-pay | Admitting: Cardiology

## 2022-04-07 ENCOUNTER — Encounter: Payer: Self-pay | Admitting: *Deleted

## 2022-04-07 ENCOUNTER — Ambulatory Visit: Payer: Medicare PPO | Attending: Cardiology | Admitting: Cardiology

## 2022-04-07 VITALS — BP 136/72 | HR 75 | Ht 67.5 in | Wt 252.4 lb

## 2022-04-07 DIAGNOSIS — I10 Essential (primary) hypertension: Secondary | ICD-10-CM

## 2022-04-07 DIAGNOSIS — I4891 Unspecified atrial fibrillation: Secondary | ICD-10-CM

## 2022-04-07 DIAGNOSIS — D6869 Other thrombophilia: Secondary | ICD-10-CM

## 2022-04-07 DIAGNOSIS — I350 Nonrheumatic aortic (valve) stenosis: Secondary | ICD-10-CM

## 2022-04-07 DIAGNOSIS — I6523 Occlusion and stenosis of bilateral carotid arteries: Secondary | ICD-10-CM

## 2022-04-07 NOTE — Progress Notes (Signed)
Clinical Summary Jennifer Cochran is a 77 y.o.female seen today for follow up of the following medical problems.    1. Aortic stenosis - she reports a very long history of heart murmur - 08/2016 echo LVEF 65-70%, moderate AS by mean grad of 21 and AVA VTI 1.26   09/2018 echo LVEF >65%, mean grad 44 mmHg, AVA VTI 0.95, dimensionless index 0.38  Jan 2021 echo LVEF 65-70%, mod to severe AS. Mean grad 37, AVA VTI 1.10, dimensionless index 0.43 02/2021 echo: LVEF 70-75%, grade II dd, normal RV, mild MR, severe AS AVA VTI 0.75 mean grad 64 DI 0.27     -02/2021 GXT exercise capacity roughly what would be expected for age and gender.  - 08/2021 echo: LVEF 70-75%, mean grade 52 mmHg, AVA VTI 0.74, DI 0.26 - in spring walking on treadmill up to 1/2 mile  - no SOB/DOE. No chest pains      2. Afib - once a month palps - no bleeding on eliquis   3.  HTN -compliant with meds     4. Hyperlipidemia - 05/2021 TC 159 TG 136 HDL 40 LDL 95  -01/2022 TC 143 TG 145 HDL 40 LDL 78 - on crestor 40     5. Carotid stenosis -07/91 RICA 8-18%, LICA 29-93% - no neuro symptoms     6. Polycystic ovaries - on aldactone    7. DM2 - followed by pcp   Past Medical History:  Diagnosis Date   Heart murmur    Hyperlipidemia    Hypertension      Allergies  Allergen Reactions   Sulfamethoxazole-Trimethoprim Rash     Current Outpatient Medications  Medication Sig Dispense Refill   apixaban (ELIQUIS) 5 MG TABS tablet TAKE (1) TABLET BY MOUTH TWICE DAILY. 60 tablet 5   Calcium Carb-Cholecalciferol (CALCIUM 600 + D PO) Take 1 tablet by mouth 2 (two) times daily.     Lactobacillus-Inulin (CULTURELLE ULTIMATE STRENGTH) CAPS Take 1 capsule by mouth 2 (two) times daily.     latanoprost (XALATAN) 0.005 % ophthalmic solution Place 1 drop into both eyes at bedtime.     losartan (COZAAR) 50 MG tablet Take 50 mg by mouth daily.     medroxyPROGESTERone (PROVERA) 2.5 MG tablet Take 2.5 mg by mouth as  directed. Take one tablet by mouth daily for first 7 days of each season every 3 months     metoprolol tartrate (LOPRESSOR) 25 MG tablet TAKE (1/2) BY MOUTH TWICE DAILY 90 tablet 2   omeprazole (PRILOSEC) 20 MG capsule Take 20 mg by mouth daily as needed.     OZEMPIC, 1 MG/DOSE, 4 MG/3ML SOPN Inject 1 mg into the skin once a week.     rosuvastatin (CRESTOR) 40 MG tablet Take 40 mg by mouth daily.     spironolactone (ALDACTONE) 25 MG tablet Take 25 mg by mouth 2 (two) times daily.     No current facility-administered medications for this visit.     Past Surgical History:  Procedure Laterality Date   COLONOSCOPY N/A 05/25/2013   Procedure: COLONOSCOPY;  Surgeon: Rogene Houston, MD;  Location: [facility];  Service: Endoscopy;  Laterality: N/A;  [place]  may 2014   [address] [place]   tooth removal  late 70's     Allergies  Allergen Reactions   Sulfamethoxazole-Trimethoprim Rash      Family History  Problem Relation Age of Onset   Stroke Mother  Heart failure Father      Social History Ms. Deol reports that she has never smoked. She has never used smokeless tobacco. Ms. Malveaux reports no history of alcohol use.   Review of Systems CONSTITUTIONAL: No weight loss, fever, chills, weakness or fatigue.  HEENT: Eyes: No visual loss, blurred vision, double vision or yellow sclerae.No hearing loss, sneezing, congestion, runny nose or sore throat.  SKIN: No rash or itching.  CARDIOVASCULAR: per hpi RESPIRATORY: No shortness of breath, cough or sputum.  GASTROINTESTINAL: No anorexia, nausea, vomiting or diarrhea. No abdominal pain or blood.  GENITOURINARY: No burning on urination, no polyuria NEUROLOGICAL: No headache, dizziness, syncope, paralysis, ataxia, numbness or tingling in the extremities. No change in bowel or bladder control.  MUSCULOSKELETAL: No muscle, back pain, joint pain or stiffness.  LYMPHATICS: No enlarged nodes. No history of  splenectomy.  PSYCHIATRIC: No history of depression or anxiety.  ENDOCRINOLOGIC: No reports of sweating, cold or heat intolerance. No polyuria or polydipsia.  Marland Kitchen   Physical Examination Today's Vitals   04/07/22 1129  BP: 136/72  Pulse: 75  SpO2: 99%  Weight: 252 lb 6.4 oz (114.5 kg)  Height: 5' 7.5" (1.715 m)   Body mass index is 38.95 kg/m.  Gen: resting comfortably, no acute distress HEENT: no scleral icterus, pupils equal round and reactive, no palptable cervical adenopathy,  CV: RRR, 3/6 systolic murmrur rusb, no jvd Resp: Clear to auscultation bilaterally GI: abdomen is soft, non-tender, non-distended, normal bowel sounds, no hepatosplenomegaly MSK: extremities are warm, no edema.  Skin: warm, no rash Neuro:  no focal deficits Psych: appropriate affect   Diagnostic Studies  08/2016 echo Study Conclusions   - Left ventricle: The cavity size was normal. Wall thickness was   increased in a pattern of mild LVH. Systolic function was   vigorous. The estimated ejection fraction was in the range of 65%   to 70%. - Aortic valve: AV ismoderately thickened, calcified with mildly   restricted motion. Peak and mean gradients through the valve are   37 and 21 mm Hg respectively.   Jan 2021 echo IMPRESSIONS      1. Left ventricular ejection fraction, by visual estimation, is 65 to 70%. The left ventricle has hyperdynamic function. There is mildly increased left ventricular hypertrophy.  2. Elevated left ventricular end-diastolic pressure.  3. The left ventricle has no regional wall motion abnormalities.  4. Diastolic dysfunction, grade indeterminate.  5. Global right ventricle has normal systolic function.The right ventricular size is normal. No increase in right ventricular wall thickness.  6. Left atrial size was normal.  7. Right atrial size was normal.  8. Mild mitral annular calcification.  9. The mitral valve is grossly normal. Trivial mitral valve regurgitation. 10.  The tricuspid valve is not well visualized. 11. The aortic valve was not well visualized. Aortic valve regurgitation is mild. Moderate to severe aortic valve stenosis. 12. Dimensionless index suggestive of moderate aortic valve stenosis but peak velocities are approaching 4 m/sec. 13. The pulmonic valve was not well visualized. Pulmonic valve regurgitation is not visualized. 14. Normal pulmonary artery systolic pressure. 15. The interatrial septum was not well visualized.     02/2021 echo IMPRESSIONS     1. Left ventricular ejection fraction, by estimation, is 70 to 75%. The  left ventricle has hyperdynamic function. The left ventricle has no  regional wall motion abnormalities. There is mild left ventricular  hypertrophy. Left ventricular diastolic  parameters are consistent with Grade II diastolic dysfunction  (  pseudonormalization). Elevated left ventricular end-diastolic pressure.   2. Right ventricular systolic function is normal. The right ventricular  size is normal. There is normal pulmonary artery systolic pressure. The  estimated right ventricular systolic pressure is 09.3 mmHg.   3. The mitral valve is abnormal. Mild mitral valve regurgitation.  Moderate mitral annular calcification.   4. The aortic valve is tricuspid. There is severe calcifcation of the  aortic valve. Aortic valve regurgitation is trivial. Severe aortic valve  stenosis. Aortic valve area, by VTI measures 0.75 cm. Aortic valve Vmax  measures 4.98 m/s. Dimentionless  index 0.27.   5. The inferior vena cava is normal in size with greater than 50%  respiratory variability, suggesting right atrial pressure of 3 mmHg.      02/2021 GXT   up sloping ST depression in the inferolateral leads (II, III and aVF) was noted.   Prior study not available for comparison.   Negative adequate stress test without evidence of ischemia at given workload. Moderately impaired exercise tolerance.   08/2021 echo IMPRESSIONS      1. Left ventricular ejection fraction, by estimation, is 70 to 75%. The  left ventricle has hyperdynamic function. The left ventricle has no  regional wall motion abnormalities. There is moderate left ventricular  hypertrophy. Left ventricular diastolic  parameters are consistent with Grade II diastolic dysfunction  (pseudonormalization). Elevated left atrial pressure. The average left  ventricular global longitudinal strain is -18.5 %. The global longitudinal  strain is normal.   2. Right ventricular systolic function is normal. The right ventricular  size is normal. Tricuspid regurgitation signal is inadequate for assessing  PA pressure.   3. The mitral valve is normal in structure. Mild mitral valve  regurgitation. Mild mitral stenosis. Moderate mitral annular  calcification.   4. The aortic valve is tricuspid. There is moderate calcification of the  aortic valve. There is moderate thickening of the aortic valve. Aortic  valve regurgitation is not visualized. Severe aortic valve stenosis.  Aortic valve mean gradient measures 51.5   mmHg. Aortic valve peak gradient measures 83.1 mmHg. Aortic valve area,  by VTI measures 0.74 cm.    Assessment and Plan   Aortic stenosis -severe AS by echo - 02/2021 recent GXT exercise capacity was reasonable based on age and gender - remains asymptomatic - monitor echo every 6 months, will need to pursue TAVR if any evidence of systolic dysfunction or onset of symptoms. As of now has not had either.        2. PAF/acquired thormbophilia - overall controlled, continue current meds including eilquis for stroke prevention     3. Carotid stenosis - asymptomatic mild to moderate stenosis - repeat carotid US later this year   4. HTN - essentially at goal, continue current meds       Arnoldo Lenis, M.D.

## 2022-04-07 NOTE — Patient Instructions (Addendum)
Medication Instructions:  Continue all current medications.   Labwork: none  Testing/Procedures: Your physician has requested that you have an echocardiogram. Echocardiography is a painless test that uses sound waves to create images of your heart. It provides your doctor with information about the size and shape of your heart and how well your heart's chambers and valves are working. This procedure takes approximately one hour. There are no restrictions for this procedure. Please do NOT wear cologne, perfume, aftershave, or lotions (deodorant is allowed). Please arrive 15 minutes prior to your appointment time. Office will contact with results via phone, letter or mychart.     Follow-Up: 6 months   Any Other Special Instructions Will Be Listed Below (If Applicable).   If you need a refill on your cardiac medications before your next appointment, please call your pharmacy.

## 2022-04-27 ENCOUNTER — Ambulatory Visit: Payer: Medicare PPO | Attending: Cardiology

## 2022-04-27 DIAGNOSIS — I350 Nonrheumatic aortic (valve) stenosis: Secondary | ICD-10-CM

## 2022-04-27 LAB — ECHOCARDIOGRAM COMPLETE
AR max vel: 0.77 cm2
AV Area VTI: 0.83 cm2
AV Area mean vel: 0.73 cm2
AV Mean grad: 59.8 mmHg
AV Peak grad: 91.6 mmHg
Ao pk vel: 4.79 m/s
Area-P 1/2: 2.3 cm2
Calc EF: 69.8 %
Est EF: >75
MV M vel: 4.36 m/s
MV Peak grad: 76.2 mmHg
P 1/2 time: 1267 msec
S' Lateral: 2.3 cm
Single Plane A2C EF: 74.4 %
Single Plane A4C EF: 68.5 %

## 2022-05-11 ENCOUNTER — Telehealth: Payer: Self-pay | Admitting: *Deleted

## 2022-05-11 NOTE — Telephone Encounter (Signed)
Laurine Blazer, LPN [id number] AM EST Back to Top    Notified, copy to pcp.

## 2022-05-11 NOTE — Telephone Encounter (Signed)
-----   Message from Arnoldo Lenis, MD sent at 05/10/2022  3:09 PM EST ----- Echo shows severe aortic stenosis, heart pumping function remains strong and she had no symptoms at last visit, just something to continue to monitor at this time  Zandra Abts MD

## 2022-05-25 ENCOUNTER — Other Ambulatory Visit: Payer: Self-pay | Admitting: Cardiology

## 2022-08-24 ENCOUNTER — Other Ambulatory Visit: Payer: Self-pay | Admitting: Cardiology

## 2022-09-21 ENCOUNTER — Ambulatory Visit: Payer: Medicare PPO | Attending: Cardiology | Admitting: Cardiology

## 2022-09-21 VITALS — BP 130/78 | HR 76 | Ht 67.5 in | Wt 250.4 lb

## 2022-09-21 DIAGNOSIS — E782 Mixed hyperlipidemia: Secondary | ICD-10-CM

## 2022-09-21 DIAGNOSIS — I6523 Occlusion and stenosis of bilateral carotid arteries: Secondary | ICD-10-CM

## 2022-09-21 DIAGNOSIS — I4891 Unspecified atrial fibrillation: Secondary | ICD-10-CM

## 2022-09-21 DIAGNOSIS — I350 Nonrheumatic aortic (valve) stenosis: Secondary | ICD-10-CM

## 2022-09-21 DIAGNOSIS — D6869 Other thrombophilia: Secondary | ICD-10-CM

## 2022-09-21 DIAGNOSIS — I10 Essential (primary) hypertension: Secondary | ICD-10-CM

## 2022-09-21 NOTE — Patient Instructions (Signed)
Medication Instructions:   Continue all current medications.   Labwork:  none  Testing/Procedures:  Your physician has requested that you have an echocardiogram. Echocardiography is a painless test that uses sound waves to create images of your heart. It provides your doctor with information about the size and shape of your heart and how well your heart's chambers and valves are working. This procedure takes approximately one hour. There are no restrictions for this procedure. Please do NOT wear cologne, perfume, aftershave, or lotions (deodorant is allowed). Please arrive 15 minutes prior to your appointment time. Your physician has requested that you have a carotid duplex. This test is an ultrasound of the carotid arteries in your neck. It looks at blood flow through these arteries that supply the brain with blood. Allow one hour for this exam. There are no restrictions or special instructions.  BOTH DUE MID AUGUST   Follow-Up:  Office will contact with results via phone, letter or mychart.    6 months   Any Other Special Instructions Will Be Listed Below (If Applicable).   If you need a refill on your cardiac medications before your next appointment, please call your pharmacy.

## 2022-09-21 NOTE — Progress Notes (Signed)
Clinical Summary Ms. Clippinger is a 77 y.o.female seen today for follow up of the following medical problems.    1. Aortic stenosis - she reports a very long history of heart murmur - 08/2016 echo LVEF 65-70%, moderate AS by mean grad of 21 and AVA VTI 1.26   09/2018 echo LVEF >65%, mean grad 44 mmHg, AVA VTI 0.95, dimensionless index 0.38  Jan 2021 echo LVEF 65-70%, mod to severe AS. Mean grad 37, AVA VTI 1.10, dimensionless index 0.43 02/2021 echo: LVEF 70-75%, grade II dd, normal RV, mild MR, severe AS AVA VTI 0.75 mean grad 64 DI 0.27     -02/2021 GXT exercise capacity roughly what would be expected for age and gender.  - 08/2021 echo: LVEF 70-75%, mean grade 52 mmHg, AVA VTI 0.74, DI 0.26  04/2022 echo: LVEF >75%, severe AS mean grad 60, AVA VTI 0.83, DI 0.29 - no symptoms, remains physically active.      2. Afib -rare palpitations, only about once a month - compliant with meds - some palpitations this morning, EKG in clnic does show afib in the 80s   3.  HTN  - homes bps 120s/70s   4. Hyperlipidemia - 05/2021 TC 159 TG 136 HDL 40 LDL 95  -01/2022 TC 143 TG 145 HDL 40 LDL 78 - on crestor 40 - 06/2022 TC 119 TG 151 HDL 37 LDL 86     5. Carotid stenosis -06/2021 RICA 1-39%, LICA 60-79%      6. Polycystic ovaries - on aldactone    7. DM2 - followed by pcp Past Medical History:  Diagnosis Date   Heart murmur    Hyperlipidemia    Hypertension      Allergies  Allergen Reactions   Sulfamethoxazole-Trimethoprim Rash     Current Outpatient Medications  Medication Sig Dispense Refill   apixaban (ELIQUIS) 5 MG TABS tablet TAKE (1) TABLET BY MOUTH TWICE DAILY. 60 tablet 5   Calcium Carb-Cholecalciferol (CALCIUM 600 + D PO) Take 1 tablet by mouth 2 (two) times daily.     Lactobacillus-Inulin (CULTURELLE ULTIMATE STRENGTH) CAPS Take 1 capsule by mouth 2 (two) times daily.     latanoprost (XALATAN) 0.005 % ophthalmic solution Place 1 drop into both eyes at  bedtime.     losartan (COZAAR) 50 MG tablet Take 50 mg by mouth daily.     medroxyPROGESTERone (PROVERA) 2.5 MG tablet Take 2.5 mg by mouth as directed. Take one tablet by mouth daily for first 7 days of each season every 3 months     metoprolol tartrate (LOPRESSOR) 25 MG tablet TAKE (1/2) BY MOUTH TWICE DAILY 90 tablet 0   omeprazole (PRILOSEC) 20 MG capsule Take 20 mg by mouth daily as needed.     OZEMPIC, 2 MG/DOSE, 8 MG/3ML SOPN Inject 2 mg into the skin once a week.     rosuvastatin (CRESTOR) 40 MG tablet Take 40 mg by mouth daily.     spironolactone (ALDACTONE) 25 MG tablet Take 25 mg by mouth 2 (two) times daily.     No current facility-administered medications for this visit.     Past Surgical History:  Procedure Laterality Date   COLONOSCOPY N/A 05/25/2013   Procedure: COLONOSCOPY;  Surgeon: Malissa Hippo, MD;  Location: [facility];  Service: Endoscopy;  Laterality: N/A;  1030   [place]  may 2014   [place] [address] [place]   tooth removal  late 70's     Allergies  Allergen  Reactions   Sulfamethoxazole-Trimethoprim Rash      Family History  Problem Relation Age of Onset   Stroke Mother    Heart failure Father      Social History Ms. Lemere reports that she has never smoked. She has never used smokeless tobacco. Ms. Blasko reports no history of alcohol use.   Review of Systems CONSTITUTIONAL: No weight loss, fever, chills, weakness or fatigue.  HEENT: Eyes: No visual loss, blurred vision, double vision or yellow sclerae.No hearing loss, sneezing, congestion, runny nose or sore throat.  SKIN: No rash or itching.  CARDIOVASCULAR: per hpi RESPIRATORY: No shortness of breath, cough or sputum.  GASTROINTESTINAL: No anorexia, nausea, vomiting or diarrhea. No abdominal pain or blood.  GENITOURINARY: No burning on urination, no polyuria NEUROLOGICAL: No headache, dizziness, syncope, paralysis, ataxia, numbness or tingling in the extremities. No  change in bowel or bladder control.  MUSCULOSKELETAL: No muscle, back pain, joint pain or stiffness.  LYMPHATICS: No enlarged nodes. No history of splenectomy.  PSYCHIATRIC: No history of depression or anxiety.  ENDOCRINOLOGIC: No reports of sweating, cold or heat intolerance. No polyuria or polydipsia.  Marland Kitchen   Physical Examination Today's Vitals   09/21/22 1040 09/21/22 1123  BP: 124/88 130/78  Pulse: 76   SpO2: 99%   Weight: 250 lb 6.4 oz (113.6 kg)   Height: 5' 7.5" (1.715 m)    Body mass index is 38.64 kg/m.  Gen: resting comfortably, no acute distress HEENT: no scleral icterus, pupils equal round and reactive, no palptable cervical adenopathy,  CV: irreg, 3/6 systolic murmur rusb, no jvd. +bilateral carotid bruits Resp: Clear to auscultation bilaterally GI: abdomen is soft, non-tender, non-distended, normal bowel sounds, no hepatosplenomegaly MSK: extremities are warm, no edema.  Skin: warm, no rash Neuro:  no focal deficits Psych: appropriate affect   Diagnostic Studies   08/2016 echo Study Conclusions   - Left ventricle: The cavity size was normal. Wall thickness was   increased in a pattern of mild LVH. Systolic function was   vigorous. The estimated ejection fraction was in the range of 65%   to 70%. - Aortic valve: AV ismoderately thickened, calcified with mildly   restricted motion. Peak and mean gradients through the valve are   37 and 21 mm Hg respectively.   Jan 2021 echo IMPRESSIONS      1. Left ventricular ejection fraction, by visual estimation, is 65 to 70%. The left ventricle has hyperdynamic function. There is mildly increased left ventricular hypertrophy.  2. Elevated left ventricular end-diastolic pressure.  3. The left ventricle has no regional wall motion abnormalities.  4. Diastolic dysfunction, grade indeterminate.  5. Global right ventricle has normal systolic function.The right ventricular size is normal. No increase in right ventricular  wall thickness.  6. Left atrial size was normal.  7. Right atrial size was normal.  8. Mild mitral annular calcification.  9. The mitral valve is grossly normal. Trivial mitral valve regurgitation. 10. The tricuspid valve is not well visualized. 11. The aortic valve was not well visualized. Aortic valve regurgitation is mild. Moderate to severe aortic valve stenosis. 12. Dimensionless index suggestive of moderate aortic valve stenosis but peak velocities are approaching 4 m/sec. 13. The pulmonic valve was not well visualized. Pulmonic valve regurgitation is not visualized. 14. Normal pulmonary artery systolic pressure. 15. The interatrial septum was not well visualized.     02/2021 echo IMPRESSIONS     1. Left ventricular ejection fraction, by estimation, is 70 to 75%. The  left ventricle has hyperdynamic function. The left ventricle has no  regional wall motion abnormalities. There is mild left ventricular  hypertrophy. Left ventricular diastolic  parameters are consistent with Grade II diastolic dysfunction  (pseudonormalization). Elevated left ventricular end-diastolic pressure.   2. Right ventricular systolic function is normal. The right ventricular  size is normal. There is normal pulmonary artery systolic pressure. The  estimated right ventricular systolic pressure is 31.5 mmHg.   3. The mitral valve is abnormal. Mild mitral valve regurgitation.  Moderate mitral annular calcification.   4. The aortic valve is tricuspid. There is severe calcifcation of the  aortic valve. Aortic valve regurgitation is trivial. Severe aortic valve  stenosis. Aortic valve area, by VTI measures 0.75 cm. Aortic valve Vmax  measures 4.98 m/s. Dimentionless  index 0.27.   5. The inferior vena cava is normal in size with greater than 50%  respiratory variability, suggesting right atrial pressure of 3 mmHg.      02/2021 GXT   up sloping ST depression in the inferolateral leads (II, III and aVF)  was noted.   Prior study not available for comparison.   Negative adequate stress test without evidence of ischemia at given workload. Moderately impaired exercise tolerance.   08/2021 echo IMPRESSIONS     1. Left ventricular ejection fraction, by estimation, is 70 to 75%. The  left ventricle has hyperdynamic function. The left ventricle has no  regional wall motion abnormalities. There is moderate left ventricular  hypertrophy. Left ventricular diastolic  parameters are consistent with Grade II diastolic dysfunction  (pseudonormalization). Elevated left atrial pressure. The average left  ventricular global longitudinal strain is -18.5 %. The global longitudinal  strain is normal.   2. Right ventricular systolic function is normal. The right ventricular  size is normal. Tricuspid regurgitation signal is inadequate for assessing  PA pressure.   3. The mitral valve is normal in structure. Mild mitral valve  regurgitation. Mild mitral stenosis. Moderate mitral annular  calcification.   4. The aortic valve is tricuspid. There is moderate calcification of the  aortic valve. There is moderate thickening of the aortic valve. Aortic  valve regurgitation is not visualized. Severe aortic valve stenosis.  Aortic valve mean gradient measures 51.5   mmHg. Aortic valve peak gradient measures 83.1 mmHg. Aortic valve area,  by VTI measures 0.74 cm.   Assessment and Plan   Aortic stenosis -severe AS by echo - 02/2021 recent GXT exercise capacity was reasonable based on age and gender - she has been asymptomatic - repeaet echo 10/2022, close surveillance with q6 months echo and close monitoring for any onset of symptoms.        2. PAF/acquired thormbophilia - some palpitations this AM, EKG does show she is in rate controlled afib today - continue lopressor, can take additional as needed - continue eliquis for stroke prevention   3. Carotid stenosis - asymptomatic mild to moderate  stenosis - we will repeat carotid US   4. HTN - at goal, continue current meds  5. Hyperlipidemia - LDL at goal, continue current meds      F/u 6 months   Antoine Poche, M.D

## 2022-10-27 ENCOUNTER — Ambulatory Visit: Payer: Medicare PPO

## 2022-10-27 ENCOUNTER — Ambulatory Visit: Payer: Medicare PPO | Attending: Cardiology

## 2022-10-27 DIAGNOSIS — I6523 Occlusion and stenosis of bilateral carotid arteries: Secondary | ICD-10-CM

## 2022-10-27 DIAGNOSIS — I350 Nonrheumatic aortic (valve) stenosis: Secondary | ICD-10-CM | POA: Diagnosis not present

## 2022-10-28 LAB — ECHOCARDIOGRAM COMPLETE
AR max vel: 0.51 cm2
AV Area VTI: 0.52 cm2
AV Area mean vel: 0.54 cm2
AV Mean grad: 61.3 mmHg
AV Peak grad: 98 mmHg
Ao pk vel: 4.95 m/s
Area-P 1/2: 2.82 cm2
Calc EF: 72 %
MV VTI: 1.41 cm2
P 1/2 time: 418 ms
S' Lateral: 2.6 cm
Single Plane A2C EF: 62.7 %
Single Plane A4C EF: 74.1 %

## 2022-11-11 ENCOUNTER — Encounter: Payer: Self-pay | Admitting: *Deleted

## 2022-11-16 ENCOUNTER — Other Ambulatory Visit: Payer: Self-pay | Admitting: Cardiology

## 2022-12-03 ENCOUNTER — Encounter: Payer: Self-pay | Admitting: *Deleted

## 2023-03-18 ENCOUNTER — Other Ambulatory Visit: Payer: Self-pay | Admitting: Cardiology

## 2023-03-18 NOTE — Telephone Encounter (Signed)
 Prescription refill request for Eliquis received. Indication: AF Last office visit: 09/21/22  Dominga Ferry MD Scr: 0.82 on 11/17/22  [facility] Age: 78 Weight: 113.6kg  Based on above findings Eliquis 5mg  twice daily is the appropriate dose.  Refill approved

## 2023-04-01 ENCOUNTER — Encounter: Payer: Self-pay | Admitting: Cardiology

## 2023-04-01 ENCOUNTER — Ambulatory Visit: Payer: Medicare PPO | Attending: Cardiology | Admitting: Cardiology

## 2023-04-01 VITALS — BP 124/74 | HR 73 | Ht 67.5 in | Wt 252.2 lb

## 2023-04-01 DIAGNOSIS — I4891 Unspecified atrial fibrillation: Secondary | ICD-10-CM | POA: Diagnosis not present

## 2023-04-01 DIAGNOSIS — I6523 Occlusion and stenosis of bilateral carotid arteries: Secondary | ICD-10-CM

## 2023-04-01 DIAGNOSIS — D6869 Other thrombophilia: Secondary | ICD-10-CM | POA: Diagnosis not present

## 2023-04-01 DIAGNOSIS — I10 Essential (primary) hypertension: Secondary | ICD-10-CM

## 2023-04-01 DIAGNOSIS — I350 Nonrheumatic aortic (valve) stenosis: Secondary | ICD-10-CM | POA: Diagnosis not present

## 2023-04-01 DIAGNOSIS — E782 Mixed hyperlipidemia: Secondary | ICD-10-CM

## 2023-04-01 NOTE — Patient Instructions (Signed)
 Medication Instructions:   Continue all current medications.   Labwork:  none  Testing/Procedures:  Your physician has requested that you have an echocardiogram. Echocardiography is a painless test that uses sound waves to create images of your heart. It provides your doctor with information about the size and shape of your heart and how well your heart's chambers and valves are working. This procedure takes approximately one hour. There are no restrictions for this procedure. Please do NOT wear cologne, perfume, aftershave, or lotions (deodorant is allowed). Please arrive 15 minutes prior to your appointment time.  Please note: We ask at that you not bring children with you during ultrasound (echo/ vascular) testing. Due to room size and safety concerns, children are not allowed in the ultrasound rooms during exams. Our front office staff cannot provide observation of children in our lobby area while testing is being conducted. An adult accompanying a patient to their appointment will only be allowed in the ultrasound room at the discretion of the ultrasound technician under special circumstances. We apologize for any inconvenience. Office will contact with results via phone, letter or mychart.     Follow-Up:  6 months   Any Other Special Instructions Will Be Listed Below (If Applicable).   If you need a refill on your cardiac medications before your next appointment, please call your pharmacy.

## 2023-04-01 NOTE — Progress Notes (Signed)
Clinical Summary Jennifer Cochran is a 78 y.o.female seen today for follow up of the following medical problems.     1. Aortic stenosis - she reports a very long history of heart murmur - 08/2016 echo LVEF 65-70%, moderate AS by mean grad of 21 and AVA VTI 1.26   09/2018 echo LVEF >65%, mean grad 44 mmHg, AVA VTI 0.95, dimensionless index 0.38  Jan 2021 echo LVEF 65-70%, mod to severe AS. Mean grad 37, AVA VTI 1.10, dimensionless index 0.43 02/2021 echo: LVEF 70-75%, grade II dd, normal RV, mild MR, severe AS AVA VTI 0.75 mean grad 64 DI 0.27     -02/2021 GXT exercise capacity roughly what would be expected for age and gender.  - 08/2021 echo: LVEF 70-75%, mean grade 52 mmHg, AVA VTI 0.74, DI 0.26   04/2022 echo: LVEF >75%, severe AS mean grad 60, AVA VTI 0.83, DI 0.29 - no symptoms, remains physically active.      10/2022 echo: LVEF 65-70%, no WMAs, mild MR, severe AS mean grad 61, AVA VTI 0.52, DI 0.18  - no SOB/DOE, no chest pains - does heavy house work sweeping, mopping, vacuuming without symtpoms.  -      2. Afib - rare symptoms - no bleeding on eliquis.    3.  HTN  - compliant with meds   4. Hyperlipidemia - 05/2021 TC 159 TG 136 HDL 40 LDL 95  -01/2022 TC 143 TG 145 HDL 40 LDL 78 - on crestor 40 - 06/2022 TC 119 TG 151 HDL 37 LDL 86 Jan 2025 TC 952 TG 841 HDL 41 LDL 98     5. Carotid stenosis -06/2021 RICA 1-39%, LICA 60-79%  - 10/2022 RICA 1-39%, LICA 60-79%     6. Polycystic ovaries - on aldactone    7. DM2 - followed by pcp Past Medical History:  Diagnosis Date   Heart murmur    Hyperlipidemia    Hypertension      Allergies  Allergen Reactions   Sulfamethoxazole-Trimethoprim Rash     Current Outpatient Medications  Medication Sig Dispense Refill   Calcium Carb-Cholecalciferol (CALCIUM 600 + D PO) Take 1 tablet by mouth 2 (two) times daily.     ELIQUIS 5 MG TABS tablet TAKE (1) TABLET BY MOUTH TWICE DAILY. 180 tablet 1    Lactobacillus-Inulin (CULTURELLE ULTIMATE STRENGTH) CAPS Take 1 capsule by mouth 2 (two) times daily.     latanoprost (XALATAN) 0.005 % ophthalmic solution Place 1 drop into both eyes at bedtime.     losartan (COZAAR) 50 MG tablet Take 50 mg by mouth daily.     medroxyPROGESTERone (PROVERA) 2.5 MG tablet Take 2.5 mg by mouth as directed. Take one tablet by mouth daily for first 7 days of each season every 3 months     metoprolol tartrate (LOPRESSOR) 25 MG tablet TAKE (1/2) BY MOUTH TWICE DAILY 90 tablet 3   omeprazole (PRILOSEC) 20 MG capsule Take 20 mg by mouth daily as needed.     OZEMPIC, 2 MG/DOSE, 8 MG/3ML SOPN Inject 2 mg into the skin once a week.     rosuvastatin (CRESTOR) 40 MG tablet Take 40 mg by mouth daily.     spironolactone (ALDACTONE) 25 MG tablet Take 25 mg by mouth 2 (two) times daily.     No current facility-administered medications for this visit.     Past Surgical History:  Procedure Laterality Date   COLONOSCOPY N/A 05/25/2013   Procedure: COLONOSCOPY;  Surgeon:  Jennifer Hippo, MD;  Location: [facility];  Service: Endoscopy;  Laterality: N/A;  1030   MELANOMA EXCISION  may 2014   Jennifer Cochran   tooth removal  late 70's     Allergies  Allergen Reactions   Sulfamethoxazole-Trimethoprim Rash      Family History  Problem Relation Age of Onset   Stroke Mother    Heart failure Father      Social History Ms. Blank reports that she has never smoked. She has never used smokeless tobacco. [name] reports no history of alcohol use.    Physical Examination Today's Vitals   04/01/23 0955  BP: 124/74  Pulse: 73  SpO2: 100%  Weight: 252 lb 3.2 oz (114.4 kg)  Height: 5' 7.5" (1.715 m)   Body mass index is 38.92 kg/m.  Gen: resting comfortably, no acute distress HEENT: no scleral icterus, pupils equal round and reactive, no palptable cervical adenopathy,  CV: RRR, 3/6 systolic murmur rusb, no jvd Resp: Clear to auscultation  bilaterally GI: abdomen is soft, non-tender, non-distended, normal bowel sounds, no hepatosplenomegaly MSK: extremities are warm, no edema.  Skin: warm, no rash Neuro:  no focal deficits Psych: appropriate affect   Diagnostic Studies 08/2016 echo Study Conclusions   - Left ventricle: The cavity size was normal. Wall thickness was   increased in a pattern of mild LVH. Systolic function was   vigorous. The estimated ejection fraction was in the range of 65%   to 70%. - Aortic valve: AV ismoderately thickened, calcified with mildly   restricted motion. Peak and mean gradients through the valve are   37 and 21 mm Hg respectively.   Jan 2021 echo IMPRESSIONS      1. Left ventricular ejection fraction, by visual estimation, is 65 to 70%. The left ventricle has hyperdynamic function. There is mildly increased left ventricular hypertrophy.  2. Elevated left ventricular end-diastolic pressure.  3. The left ventricle has no regional wall motion abnormalities.  4. Diastolic dysfunction, grade indeterminate.  5. Global right ventricle has normal systolic function.The right ventricular size is normal. No increase in right ventricular wall thickness.  6. Left atrial size was normal.  7. Right atrial size was normal.  8. Mild mitral annular calcification.  9. The mitral valve is grossly normal. Trivial mitral valve regurgitation. 10. The tricuspid valve is not well visualized. 11. The aortic valve was not well visualized. Aortic valve regurgitation is mild. Moderate to severe aortic valve stenosis. 12. Dimensionless index suggestive of moderate aortic valve stenosis but peak velocities are approaching 4 m/sec. 13. The pulmonic valve was not well visualized. Pulmonic valve regurgitation is not visualized. 14. Normal pulmonary artery systolic pressure. 15. The interatrial septum was not well visualized.     02/2021 echo IMPRESSIONS     1. Left ventricular ejection fraction, by estimation,  is 70 to 75%. The  left ventricle has hyperdynamic function. The left ventricle has no  regional wall motion abnormalities. There is mild left ventricular  hypertrophy. Left ventricular diastolic  parameters are consistent with Grade II diastolic dysfunction  (pseudonormalization). Elevated left ventricular end-diastolic pressure.   2. Right ventricular systolic function is normal. The right ventricular  size is normal. There is normal pulmonary artery systolic pressure. The  estimated right ventricular systolic pressure is 31.5 mmHg.   3. The mitral valve is abnormal. Mild mitral valve regurgitation.  Moderate mitral annular calcification.   4. The aortic valve is tricuspid. There is severe calcifcation of the  aortic valve. Aortic valve regurgitation is trivial. Severe aortic valve  stenosis. Aortic valve area, by VTI measures 0.75 cm. Aortic valve Vmax  measures 4.98 m/s. Dimentionless  index 0.27.   5. The inferior vena cava is normal in size with greater than 50%  respiratory variability, suggesting right atrial pressure of 3 mmHg.      02/2021 GXT   up sloping ST depression in the inferolateral leads (II, III and aVF) was noted.   Prior study not available for comparison.   Negative adequate stress test without evidence of ischemia at given workload. Moderately impaired exercise tolerance.   08/2021 echo IMPRESSIONS     1. Left ventricular ejection fraction, by estimation, is 70 to 75%. The  left ventricle has hyperdynamic function. The left ventricle has no  regional wall motion abnormalities. There is moderate left ventricular  hypertrophy. Left ventricular diastolic  parameters are consistent with Grade II diastolic dysfunction  (pseudonormalization). Elevated left atrial pressure. The average left  ventricular global longitudinal strain is -18.5 %. The global longitudinal  strain is normal.   2. Right ventricular systolic function is normal. The right ventricular   size is normal. Tricuspid regurgitation signal is inadequate for assessing  PA pressure.   3. The mitral valve is normal in structure. Mild mitral valve  regurgitation. Mild mitral stenosis. Moderate mitral annular  calcification.   4. The aortic valve is tricuspid. There is moderate calcification of the  aortic valve. There is moderate thickening of the aortic valve. Aortic  valve regurgitation is not visualized. Severe aortic valve stenosis.  Aortic valve mean gradient measures 51.5   mmHg. Aortic valve peak gradient measures 83.1 mmHg. Aortic valve area,  by VTI measures 0.74 cm.    10/2022 echo 1. Left ventricular ejection fraction, by estimation, is 65 to 70%. The  left ventricle has normal function. The left ventricle has no regional  wall motion abnormalities. There is mild left ventricular hypertrophy.  Left ventricular diastolic parameters  are indeterminate.   2. Right ventricular systolic function is normal. The right ventricular  size is normal. Tricuspid regurgitation signal is inadequate for assessing  PA pressure.   3. The mitral valve is abnormal. Mild mitral valve regurgitation.  Moderate mitral stenosis. Moderate mitral annular calcification.   4. The tricuspid valve is abnormal.   5. The aortic valve is tricuspid. There is moderate calcification of the  aortic valve. There is moderate thickening of the aortic valve. Aortic  valve regurgitation is mild. Severe aortic valve stenosis. Aortic valve  mean gradient measures 61.3 mmHg.  Aortic valve peak gradient measures 98.0 mmHg. Aortic valve area, by VTI  measures 0.52 cm.   6. The inferior vena cava is normal in size with greater than 50%  respiratory variability, suggesting right atrial pressure of 3 mmHg.    Assessment and Plan   Aortic stenosis -severe AS, has been asymptomatic - 02/2021 recent GXT exercise capacity was reasonable based on age and gender - close surveillance, repeat echo 04/2023. Pending  echo consider GXT. Valve fairly severe however continues to deny symptoms and her LVEF has been normal, have not had an indication to pursue intervention.        2. PAF/acquired thormbophilia - rare symptoms, continue current meds including eliquis for stroke pevention   3. Carotid stenosis - asymptomatic mild to moderate stenosis - repeat carotid US later this year  4. HTN - she is at goal, continue current meds   5. Hyperlipidemia - LDL <100  reasonable for her, she is at goal. Continue current meds     Antoine Poche, M.D

## 2023-04-23 ENCOUNTER — Encounter (INDEPENDENT_AMBULATORY_CARE_PROVIDER_SITE_OTHER): Payer: Self-pay | Admitting: *Deleted

## 2023-05-04 ENCOUNTER — Ambulatory Visit: Payer: Medicare PPO | Attending: Cardiology

## 2023-05-04 ENCOUNTER — Telehealth: Payer: Self-pay | Admitting: Cardiology

## 2023-05-04 DIAGNOSIS — I350 Nonrheumatic aortic (valve) stenosis: Secondary | ICD-10-CM | POA: Diagnosis not present

## 2023-05-04 LAB — ECHOCARDIOGRAM COMPLETE
AR max vel: 0.45 cm2
AV Area VTI: 0.47 cm2
AV Area mean vel: 0.51 cm2
AV Mean grad: 58.8 mm[Hg]
AV Peak grad: 96.6 mm[Hg]
Ao pk vel: 4.92 m/s
Area-P 1/2: 2.39 cm2
Calc EF: 79.7 %
MV M vel: 6.8 m/s
MV Peak grad: 185 mm[Hg]
MV VTI: 1.46 cm2
P 1/2 time: 401 ms
S' Lateral: 2.6 cm
Single Plane A2C EF: 79.6 %
Single Plane A4C EF: 76.3 %

## 2023-05-04 NOTE — Telephone Encounter (Signed)
 Patient is due Colonoscopy

## 2023-05-20 ENCOUNTER — Encounter: Payer: Self-pay | Admitting: *Deleted

## 2023-05-20 ENCOUNTER — Telehealth: Payer: Self-pay | Admitting: *Deleted

## 2023-05-20 DIAGNOSIS — I350 Nonrheumatic aortic (valve) stenosis: Secondary | ICD-10-CM

## 2023-05-20 NOTE — Telephone Encounter (Signed)
 Notified, copy to pcp.  She agrees to doing the GXT.  Will send instruction sheet to patient via mychart & to pcc for scheduling.

## 2023-05-20 NOTE — Telephone Encounter (Signed)
-----   Message from Dina Rich sent at 05/12/2023 10:43 AM EST ----- Echo shows normal heart pumping function. AOrtic valve remains severely stiffened. Can we order a GXT for aortic stenosis to assess her exercise capacity. Hold metoprolol day of  Dominga Ferry MD

## 2023-05-24 ENCOUNTER — Telehealth: Payer: Self-pay | Admitting: Cardiology

## 2023-05-24 NOTE — Telephone Encounter (Signed)
 Checking percert on the following patient for testing scheduled at [facility].    GXT   05/27/2023

## 2023-05-27 ENCOUNTER — Ambulatory Visit (HOSPITAL_COMMUNITY)
Admission: RE | Admit: 2023-05-27 | Discharge: 2023-05-27 | Disposition: A | Discharge status: Home / Self Care | Source: Ambulatory Visit | Attending: Cardiology | Admitting: Cardiology

## 2023-05-27 DIAGNOSIS — I350 Nonrheumatic aortic (valve) stenosis: Secondary | ICD-10-CM | POA: Diagnosis present

## 2023-05-27 LAB — EXERCISE TOLERANCE TEST
Angina Index: 0
Estimated workload: 4.6
Exercise duration (min): 5 min
Exercise duration (sec): 1 s
MPHR: 144 {beats}/min
Peak HR: 155 {beats}/min
Percent HR: 107 %
RPE: 13
Rest HR: 93 {beats}/min

## 2023-06-28 ENCOUNTER — Encounter: Payer: Self-pay | Admitting: *Deleted

## 2023-08-18 ENCOUNTER — Telehealth: Payer: Self-pay | Admitting: Gastroenterology

## 2023-08-18 NOTE — Telephone Encounter (Addendum)
 Patient reached out to me at a public setting inquiring about whether she should complete colonoscopy right now. She recently received her recall letter. She requested my advise.   Records reviewed: She has severe aortic stenosis and Afib. She is followed closely by Dr. Amanda Jungling. LVEF normal. Overall fairly asymptomatic. Recent exercise stress test was good for age and gender.   She had small tubular adenoma removed at time of colonoscopy in 2015. Advised 10 years follow up at that time.   She had recent labs last month, no anemia.  She denies GI symptoms.   Communicated with patient, if she desires one last colonoscopy, she will need to have it done in [place] due to her severe aortic stenosis. Could consider cologuard first.   Patient has decided to hold off on colon cancer screening for now. She will let us  know if she decides to pursue any evaluation in the future.

## 2023-09-07 DIAGNOSIS — Z1231 Encounter for screening mammogram for malignant neoplasm of breast: Secondary | ICD-10-CM | POA: Diagnosis not present

## 2023-09-07 DIAGNOSIS — Z01419 Encounter for gynecological examination (general) (routine) without abnormal findings: Secondary | ICD-10-CM | POA: Diagnosis not present

## 2023-09-07 DIAGNOSIS — Z2821 Immunization not carried out because of patient refusal: Secondary | ICD-10-CM | POA: Diagnosis not present

## 2023-09-07 DIAGNOSIS — Z719 Counseling, unspecified: Secondary | ICD-10-CM | POA: Diagnosis not present

## 2023-09-07 DIAGNOSIS — N950 Postmenopausal bleeding: Secondary | ICD-10-CM | POA: Diagnosis not present

## 2023-09-13 ENCOUNTER — Other Ambulatory Visit: Payer: Self-pay | Admitting: Cardiology

## 2023-09-13 NOTE — Telephone Encounter (Signed)
 Prescription refill request for Eliquis  received. Indication: AF Last office visit: 04/01/23  JINNY Ross MD Scr: 0.9 on 07/21/23  [facility] Age: 78 Weight: 114.4kg  Based on above findings Eliquis  5mg  twice daily is the appropriate dose.  Refill approved.

## 2023-09-17 ENCOUNTER — Encounter: Payer: Self-pay | Admitting: Cardiology

## 2023-09-17 ENCOUNTER — Ambulatory Visit: Attending: Cardiology | Admitting: Cardiology

## 2023-09-17 VITALS — BP 110/64 | HR 75 | Ht 67.5 in | Wt 253.8 lb

## 2023-09-17 DIAGNOSIS — I4891 Unspecified atrial fibrillation: Secondary | ICD-10-CM | POA: Diagnosis not present

## 2023-09-17 DIAGNOSIS — I6523 Occlusion and stenosis of bilateral carotid arteries: Secondary | ICD-10-CM | POA: Diagnosis not present

## 2023-09-17 DIAGNOSIS — I350 Nonrheumatic aortic (valve) stenosis: Secondary | ICD-10-CM | POA: Diagnosis not present

## 2023-09-17 DIAGNOSIS — I10 Essential (primary) hypertension: Secondary | ICD-10-CM

## 2023-09-17 DIAGNOSIS — E782 Mixed hyperlipidemia: Secondary | ICD-10-CM

## 2023-09-17 NOTE — Patient Instructions (Addendum)
 Medication Instructions:   Continue all current medications.   Labwork:  none  Testing/Procedures:  Your physician has requested that you have an echocardiogram. Echocardiography is a painless test that uses sound waves to create images of your heart. It provides your doctor with information about the size and shape of your heart and how well your heart's chambers and valves are working. This procedure takes approximately one hour. There are no restrictions for this procedure. Please do NOT wear cologne, perfume, aftershave, or lotions (deodorant is allowed). Please arrive 15 minutes prior to your appointment time.  Please note: We ask at that you not bring children with you during ultrasound (echo/ vascular) testing. Due to room size and safety concerns, children are not allowed in the ultrasound rooms during exams. Our front office staff cannot provide observation of children in our lobby area while testing is being conducted. An adult accompanying a patient to their appointment will only be allowed in the ultrasound room at the discretion of the ultrasound technician under special circumstances. We apologize for any inconvenience. Your physician has requested that you have a carotid duplex. This test is an ultrasound of the carotid arteries in your neck. It looks at blood flow through these arteries that supply the brain with blood. Allow one hour for this exam. There are no restrictions or special instructions.  DUE AUGUST   Follow-Up:  4 months   Any Other Special Instructions Will Be Listed Below (If Applicable).   If you need a refill on your cardiac medications before your next appointment, please call your pharmacy.

## 2023-09-17 NOTE — Progress Notes (Signed)
 Clinical Summary Jennifer Cochran is a 78 y.o.female seen today for follow up of the following medical problems.      1. Aortic stenosis - she reports a very long history of heart murmur - 08/2016 echo LVEF 65-70%, moderate AS by mean grad of 21 and AVA VTI 1.26   09/2018 echo LVEF >65%, mean grad 44 mmHg, AVA VTI 0.95, dimensionless index 0.38  Jan 2021 echo LVEF 65-70%, mod to severe AS. Mean grad 37, AVA VTI 1.10, dimensionless index 0.43 02/2021 echo: LVEF 70-75%, grade II dd, normal RV, mild MR, severe AS AVA VTI 0.75 mean grad 64 DI 0.27     -02/2021 GXT exercise capacity roughly what would be expected for age and gender.  - 08/2021 echo: LVEF 70-75%, mean grade 52 mmHg, AVA VTI 0.74, DI 0.26   04/2022 echo: LVEF >75%, severe AS mean grad 60, AVA VTI 0.83, DI 0.29 10/2022 echo: LVEF 65-70%, no WMAs, mild MR, severe AS mean grad 61, AVA VTI 0.52, DI 0.18       04/2023 echo: LVEF 70-75%, no WMAs, indet diastolic, normal RV function, mild to mod MR, severe AS mean grad 59, AVA VTI 0.47 05/2023 GXT  Exercise capacity was normal. Patient exercised for 5 min and 1 sec. Maximum HR of 155 bpm. MPHR 107.0%. Peak METS 4.6. Hypertensive blood pressure response noted during stress.   The ECG was negative for ischemia with equivocal ST segment changes. -exercise was 100% of predicted based on age and gender.  - no recent symptoms.    2. Afib - denies significant symptoms - no bleeding on eliquis    3.  HTN  - compliant with meds   4. Hyperlipidemia - 05/2021 TC 159 TG 136 HDL 40 LDL 95  -01/2022 TC 143 TG 145 HDL 40 LDL 78 - on crestor 40 - 06/2022 TC 119 TG 151 HDL 37 LDL 86 Jan 2025 TC 838 TG 874 HDL 41 LDL 98     5. Carotid stenosis -06/2021 RICA 1-39%, LICA 60-79%  - 10/2022 RICA 1-39%, LICA 60-79%     6. Polycystic ovaries - on aldactone    7. DM2 - followed by pcp Past Medical History:  Diagnosis Date   Heart murmur    Hyperlipidemia    Hypertension       Allergies  Allergen Reactions   Sulfamethoxazole-Trimethoprim Rash     Current Outpatient Medications  Medication Sig Dispense Refill   Calcium Carb-Cholecalciferol (CALCIUM 600 + D PO) Take 1 tablet by mouth 2 (two) times daily.     ELIQUIS  5 MG TABS tablet TAKE (1) TABLET BY MOUTH TWICE DAILY. 180 tablet 1   Lactobacillus-Inulin (CULTURELLE ULTIMATE STRENGTH) CAPS Take 1 capsule by mouth 2 (two) times daily.     latanoprost (XALATAN) 0.005 % ophthalmic solution Place 1 drop into both eyes at bedtime.     losartan (COZAAR) 50 MG tablet Take 50 mg by mouth daily.     medroxyPROGESTERone (PROVERA) 2.5 MG tablet Take 2.5 mg by mouth as directed. Take one tablet by mouth daily for first 7 days of each season every 3 months     metoprolol  tartrate (LOPRESSOR ) 25 MG tablet TAKE (1/2) BY MOUTH TWICE DAILY 90 tablet 3   omeprazole (PRILOSEC) 20 MG capsule Take 20 mg by mouth daily as needed.     OZEMPIC, 2 MG/DOSE, 8 MG/3ML SOPN Inject 2 mg into the skin once a week.     rosuvastatin (CRESTOR) 40 MG  tablet Take 40 mg by mouth daily.     spironolactone (ALDACTONE) 25 MG tablet Take 25 mg by mouth 2 (two) times daily.     No current facility-administered medications for this visit.     Past Surgical History:  Procedure Laterality Date   COLONOSCOPY N/A 05/25/2013   Procedure: COLONOSCOPY;  Surgeon: Claudis RAYMOND Rivet, MD;  Location: [facility];  Service: Endoscopy;  Laterality: N/A;  1030   MELANOMA EXCISION  may 2014   [place] Dr. Shona   tooth removal  late 70's     Allergies  Allergen Reactions   Sulfamethoxazole-Trimethoprim Rash      Family History  Problem Relation Age of Onset   Stroke Mother    Heart failure Father      Social History Jennifer Cochran reports that she has never smoked. She has never used smokeless tobacco. Jennifer Cochran reports no history of alcohol use.     Physical Examination Today's Vitals   09/17/23 0920  BP: 110/64  Pulse: 75   SpO2: 98%  Weight: 253 lb 12.8 oz (115.1 kg)  Height: 5' 7.5 (1.715 m)   Body mass index is 39.16 kg/m.  Gen: resting comfortably, no acute distress HEENT: no scleral icterus, pupils equal round and reactive, no palptable cervical adenopathy,  CV: RRR, 3/6 systolic murmur rusb, bilateral carotid bruits Resp: Clear to auscultation bilaterally GI: abdomen is soft, non-tender, non-distended, normal bowel sounds, no hepatosplenomegaly MSK: extremities are warm, no edema.  Skin: warm, no rash Neuro:  no focal deficits Psych: appropriate affect   Diagnostic Studies 08/2016 echo Study Conclusions   - Left ventricle: The cavity size was normal. Wall thickness was   increased in a pattern of mild LVH. Systolic function was   vigorous. The estimated ejection fraction was in the range of 65%   to 70%. - Aortic valve: AV ismoderately thickened, calcified with mildly   restricted motion. Peak and mean gradients through the valve are   37 and 21 mm Hg respectively.   Jan 2021 echo IMPRESSIONS      1. Left ventricular ejection fraction, by visual estimation, is 65 to 70%. The left ventricle has hyperdynamic function. There is mildly increased left ventricular hypertrophy.  2. Elevated left ventricular end-diastolic pressure.  3. The left ventricle has no regional wall motion abnormalities.  4. Diastolic dysfunction, grade indeterminate.  5. Global right ventricle has normal systolic function.The right ventricular size is normal. No increase in right ventricular wall thickness.  6. Left atrial size was normal.  7. Right atrial size was normal.  8. Mild mitral annular calcification.  9. The mitral valve is grossly normal. Trivial mitral valve regurgitation. 10. The tricuspid valve is not well visualized. 11. The aortic valve was not well visualized. Aortic valve regurgitation is mild. Moderate to severe aortic valve stenosis. 12. Dimensionless index suggestive of moderate aortic valve  stenosis but peak velocities are approaching 4 m/sec. 13. The pulmonic valve was not well visualized. Pulmonic valve regurgitation is not visualized. 14. Normal pulmonary artery systolic pressure. 15. The interatrial septum was not well visualized.     02/2021 echo IMPRESSIONS     1. Left ventricular ejection fraction, by estimation, is 70 to 75%. The  left ventricle has hyperdynamic function. The left ventricle has no  regional wall motion abnormalities. There is mild left ventricular  hypertrophy. Left ventricular diastolic  parameters are consistent with Grade II diastolic dysfunction  (pseudonormalization). Elevated left ventricular end-diastolic pressure.   2. Right  ventricular systolic function is normal. The right ventricular  size is normal. There is normal pulmonary artery systolic pressure. The  estimated right ventricular systolic pressure is 31.5 mmHg.   3. The mitral valve is abnormal. Mild mitral valve regurgitation.  Moderate mitral annular calcification.   4. The aortic valve is tricuspid. There is severe calcifcation of the  aortic valve. Aortic valve regurgitation is trivial. Severe aortic valve  stenosis. Aortic valve area, by VTI measures 0.75 cm. Aortic valve Vmax  measures 4.98 m/s. Dimentionless  index 0.27.   5. The inferior vena cava is normal in size with greater than 50%  respiratory variability, suggesting right atrial pressure of 3 mmHg.      02/2021 GXT   up sloping ST depression in the inferolateral leads (II, III and aVF) was noted.   Prior study not available for comparison.   Negative adequate stress test without evidence of ischemia at given workload. Moderately impaired exercise tolerance.   08/2021 echo IMPRESSIONS     1. Left ventricular ejection fraction, by estimation, is 70 to 75%. The  left ventricle has hyperdynamic function. The left ventricle has no  regional wall motion abnormalities. There is moderate left ventricular   hypertrophy. Left ventricular diastolic  parameters are consistent with Grade II diastolic dysfunction  (pseudonormalization). Elevated left atrial pressure. The average left  ventricular global longitudinal strain is -18.5 %. The global longitudinal  strain is normal.   2. Right ventricular systolic function is normal. The right ventricular  size is normal. Tricuspid regurgitation signal is inadequate for assessing  PA pressure.   3. The mitral valve is normal in structure. Mild mitral valve  regurgitation. Mild mitral stenosis. Moderate mitral annular  calcification.   4. The aortic valve is tricuspid. There is moderate calcification of the  aortic valve. There is moderate thickening of the aortic valve. Aortic  valve regurgitation is not visualized. Severe aortic valve stenosis.  Aortic valve mean gradient measures 51.5   mmHg. Aortic valve peak gradient measures 83.1 mmHg. Aortic valve area,  by VTI measures 0.74 cm.      10/2022 echo 1. Left ventricular ejection fraction, by estimation, is 65 to 70%. The  left ventricle has normal function. The left ventricle has no regional  wall motion abnormalities. There is mild left ventricular hypertrophy.  Left ventricular diastolic parameters  are indeterminate.   2. Right ventricular systolic function is normal. The right ventricular  size is normal. Tricuspid regurgitation signal is inadequate for assessing  PA pressure.   3. The mitral valve is abnormal. Mild mitral valve regurgitation.  Moderate mitral stenosis. Moderate mitral annular calcification.   4. The tricuspid valve is abnormal.   5. The aortic valve is tricuspid. There is moderate calcification of the  aortic valve. There is moderate thickening of the aortic valve. Aortic  valve regurgitation is mild. Severe aortic valve stenosis. Aortic valve  mean gradient measures 61.3 mmHg.  Aortic valve peak gradient measures 98.0 mmHg. Aortic valve area, by VTI  measures 0.52  cm.   6. The inferior vena cava is normal in size with greater than 50%  respiratory variability, suggesting right atrial pressure of 3 mmHg.      04/2023 echo 1. Left ventricular ejection fraction, by estimation, is 70 to 75%. The  left ventricle has hyperdynamic function. The left ventricle has no  regional wall motion abnormalities. There is mild concentric left  ventricular hypertrophy. Left ventricular  diastolic parameters are indeterminate.   2. Right  ventricular systolic function is normal. The right ventricular  size is normal. There is moderately elevated pulmonary artery systolic  pressure. The estimated right ventricular systolic pressure is 55.1 mmHg.   3. Left atrial size was upper normal.   4. The mitral valve is degenerative. Mild to moderate mitral valve  regurgitation. Mild to moderate mitral stenosis. Mean MV gradient 3-6  mmHg. Moderate to severe mitral annular calcification.   5. The aortic valve is tricuspid. There is severe calcifcation of the  aortic valve. Aortic valve regurgitation is mild. Severe aortic valve  stenosis. Aortic valve mean gradient measures 58.8 mmHg. Aortic valve Vmax  measures 4.92 m/s. Dimentionless index   0.16.   6. The inferior vena cava is normal in size with <50% respiratory  variability, suggesting right atrial pressure of 8 mmHg.    Assessment and Plan  Aortic stenosis -severe AS, has been asymptomatic - recent GXT showed exercise capacity is 100% of predicted based on gender and age - continue to monitor closely, echos every 6 months. Tight valve but has not had symptoms, has normal LVEF, and recently did fine on GXT. Valve intervention has not been indicated as of yet but do suspect in the near future will need to pursue.  - repeat echo 10/2023     2. PAF/acquired thormbophilia - doing well, continue current meds including eliquis  for stroke prevention.  - EKG today shows rate controlled afib  3. Carotid stenosis -  asymptomatic mild to moderate stenosis - repeat carotid US    4. HTN - at goal, continue current meds   5. Hyperlipidemia - LDL at goal, continue current meds   F/u 4 months        Jennifer Cochran, M.D.,

## 2023-10-05 DIAGNOSIS — H353131 Nonexudative age-related macular degeneration, bilateral, early dry stage: Secondary | ICD-10-CM | POA: Diagnosis not present

## 2023-10-05 DIAGNOSIS — H25813 Combined forms of age-related cataract, bilateral: Secondary | ICD-10-CM | POA: Diagnosis not present

## 2023-10-05 DIAGNOSIS — E119 Type 2 diabetes mellitus without complications: Secondary | ICD-10-CM | POA: Diagnosis not present

## 2023-10-05 DIAGNOSIS — H40053 Ocular hypertension, bilateral: Secondary | ICD-10-CM | POA: Diagnosis not present

## 2023-10-19 ENCOUNTER — Ambulatory Visit: Attending: Cardiology

## 2023-10-19 ENCOUNTER — Ambulatory Visit (INDEPENDENT_AMBULATORY_CARE_PROVIDER_SITE_OTHER)

## 2023-10-19 DIAGNOSIS — I350 Nonrheumatic aortic (valve) stenosis: Secondary | ICD-10-CM | POA: Diagnosis not present

## 2023-10-19 DIAGNOSIS — I6523 Occlusion and stenosis of bilateral carotid arteries: Secondary | ICD-10-CM

## 2023-10-19 LAB — ECHOCARDIOGRAM COMPLETE
AR max vel: 0.69 cm2
AV Area VTI: 0.75 cm2
AV Mean grad: 61 mmHg
AV Peak grad: 100.8 mmHg
Ao pk vel: 5.02 m/s
MV VTI: 1.49 cm2
S' Lateral: 2.6 cm
Single Plane A2C EF: 65.9 %
Single Plane A4C EF: 83 %

## 2023-10-25 LAB — VAS US CAROTID: Single Plane A4C EF: 83 %

## 2023-10-27 DIAGNOSIS — E1165 Type 2 diabetes mellitus with hyperglycemia: Secondary | ICD-10-CM | POA: Diagnosis not present

## 2023-11-01 ENCOUNTER — Ambulatory Visit: Payer: Self-pay | Admitting: Cardiology

## 2023-11-01 ENCOUNTER — Encounter: Payer: Self-pay | Admitting: *Deleted

## 2023-11-02 DIAGNOSIS — E1165 Type 2 diabetes mellitus with hyperglycemia: Secondary | ICD-10-CM | POA: Diagnosis not present

## 2023-11-02 DIAGNOSIS — I6522 Occlusion and stenosis of left carotid artery: Secondary | ICD-10-CM | POA: Diagnosis not present

## 2023-11-02 DIAGNOSIS — H40053 Ocular hypertension, bilateral: Secondary | ICD-10-CM | POA: Diagnosis not present

## 2023-11-02 DIAGNOSIS — E282 Polycystic ovarian syndrome: Secondary | ICD-10-CM | POA: Diagnosis not present

## 2023-11-02 DIAGNOSIS — I10 Essential (primary) hypertension: Secondary | ICD-10-CM | POA: Diagnosis not present

## 2023-11-02 DIAGNOSIS — D696 Thrombocytopenia, unspecified: Secondary | ICD-10-CM | POA: Diagnosis not present

## 2023-11-02 DIAGNOSIS — I4891 Unspecified atrial fibrillation: Secondary | ICD-10-CM | POA: Diagnosis not present

## 2023-11-02 DIAGNOSIS — E875 Hyperkalemia: Secondary | ICD-10-CM | POA: Diagnosis not present

## 2023-11-02 DIAGNOSIS — I350 Nonrheumatic aortic (valve) stenosis: Secondary | ICD-10-CM | POA: Diagnosis not present

## 2023-11-09 ENCOUNTER — Other Ambulatory Visit: Payer: Self-pay | Admitting: Cardiology

## 2023-12-02 NOTE — Telephone Encounter (Signed)
-----   Message from Alvan Carrier sent at 12/02/2023  1:57 PM EDT ----- Echo shows heart function remains strong. Her aortic valve remains severely stiff, at any signs of new symptoms we will need to consider possibly intervention, for now continue to monitor closely  JINNY Alvan DM ----- Message ----- From: Interface, [employer] Sent: 10/19/2023  11:21 AM EDT To: Carrier JULIANNA Alvan, MD

## 2023-12-02 NOTE — Telephone Encounter (Signed)
 Notified, copy to pcp.

## 2024-01-18 ENCOUNTER — Encounter: Payer: Self-pay | Admitting: Cardiology

## 2024-01-18 ENCOUNTER — Ambulatory Visit: Attending: Cardiology | Admitting: Cardiology

## 2024-01-18 VITALS — BP 132/84 | HR 80 | Ht 67.0 in | Wt 248.0 lb

## 2024-01-18 DIAGNOSIS — I6523 Occlusion and stenosis of bilateral carotid arteries: Secondary | ICD-10-CM

## 2024-01-18 DIAGNOSIS — I10 Essential (primary) hypertension: Secondary | ICD-10-CM

## 2024-01-18 DIAGNOSIS — I350 Nonrheumatic aortic (valve) stenosis: Secondary | ICD-10-CM

## 2024-01-18 DIAGNOSIS — I4891 Unspecified atrial fibrillation: Secondary | ICD-10-CM | POA: Diagnosis not present

## 2024-01-18 DIAGNOSIS — E782 Mixed hyperlipidemia: Secondary | ICD-10-CM

## 2024-01-18 NOTE — Progress Notes (Signed)
 Clinical Summary Jennifer Cochran is a 78 y.o.female seen today for follow up of the following medical problems.      1. Aortic stenosis - she reports a very long history of heart murmur - 08/2016 echo LVEF 65-70%, moderate AS by mean grad of 21 and AVA VTI 1.26   09/2018 echo LVEF >65%, mean grad 44 mmHg, AVA VTI 0.95, dimensionless index 0.38  Jan 2021 echo LVEF 65-70%, mod to severe AS. Mean grad 37, AVA VTI 1.10, dimensionless index 0.43 02/2021 echo: LVEF 70-75%, grade II dd, normal RV, mild MR, severe AS AVA VTI 0.75 mean grad 64 DI 0.27     -02/2021 GXT exercise capacity roughly what would be expected for age and gender.  - 08/2021 echo: LVEF 70-75%, mean grade 52 mmHg, AVA VTI 0.74, DI 0.26   04/2022 echo: LVEF >75%, severe AS mean grad 60, AVA VTI 0.83, DI 0.29 10/2022 echo: LVEF 65-70%, no WMAs, mild MR, severe AS mean grad 61, AVA VTI 0.52, DI 0.18        04/2023 echo: LVEF 70-75%, no WMAs, indet diastolic, normal RV function, mild to mod MR, severe AS mean grad 59, AVA VTI 0.47 05/2023 GXT  Exercise capacity was normal. Patient exercised for 5 min and 1 sec. Maximum HR of 155 bpm. MPHR 107.0%. Peak METS 4.6. Hypertensive blood pressure response noted during stress.   The ECG was negative for ischemia with equivocal ST segment changes. -exercise was 100% of predicted based on age and gender.  - no recent symptoms.   10/2023 echo: LVEF 60-65%, no WMAs, severe AS AvA VTI 0.75, mean grad 61, DI 0.22 Denies any exertional symptoms - walks 1/4 mile in 10 minutes.    2. Afib - no palpitations - no bleeding on eliquis    3.  HTN  - compliant with meds - home bp's typically 110s/80s - often higher in MD office.    4. Hyperlipidemia - 05/2021 TC 159 TG 136 HDL 40 LDL 95  -01/2022 TC 143 TG 145 HDL 40 LDL 78 - on crestor 40 - 06/2022 TC 119 TG 151 HDL 37 LDL 86 Jan 2025 TC 838 TG 874 HDL 41 LDL 98     5. Carotid stenosis -06/2021 RICA 1-39%, LICA 60-79%  - 10/2022 RICA  1-39%, LICA 60-79% 10/2023 RICA 40-59%, LICA 60-79%   6. Polycystic ovaries - on aldactone    7. DM2 - followed by pcp Past Medical History:  Diagnosis Date   Heart murmur    Hyperlipidemia    Hypertension      Allergies  Allergen Reactions   Sulfamethoxazole-Trimethoprim Rash     Current Outpatient Medications  Medication Sig Dispense Refill   Calcium Carb-Cholecalciferol (CALCIUM 600 + D PO) Take 1 tablet by mouth 2 (two) times daily.     ELIQUIS  5 MG TABS tablet TAKE (1) TABLET BY MOUTH TWICE DAILY. 180 tablet 1   Lactobacillus-Inulin (CULTURELLE ULTIMATE STRENGTH) CAPS Take 1 capsule by mouth 2 (two) times daily.     latanoprost (XALATAN) 0.005 % ophthalmic solution Place 1 drop into both eyes at bedtime.     losartan (COZAAR) 50 MG tablet Take 50 mg by mouth daily.     medroxyPROGESTERone (PROVERA) 2.5 MG tablet Take 2.5 mg by mouth as directed. Take one tablet by mouth daily for first 7 days of each season every 3 months     metFORMIN (GLUCOPHAGE) 500 MG tablet Take 500 mg by mouth daily with breakfast.  metoprolol  tartrate (LOPRESSOR ) 25 MG tablet TAKE (1/2) BY MOUTH TWICE DAILY 90 tablet 3   omeprazole (PRILOSEC) 20 MG capsule Take 20 mg by mouth daily as needed.     OZEMPIC, 2 MG/DOSE, 8 MG/3ML SOPN Inject 2 mg into the skin once a week.     rosuvastatin (CRESTOR) 40 MG tablet Take 40 mg by mouth daily.     spironolactone (ALDACTONE) 25 MG tablet Take 25 mg by mouth 2 (two) times daily.     No current facility-administered medications for this visit.     Past Surgical History:  Procedure Laterality Date   COLONOSCOPY N/A 05/25/2013   Procedure: COLONOSCOPY;  Surgeon: Claudis RAYMOND Rivet, MD;  Location: [facility];  Service: Endoscopy;  Laterality: N/A;  1030   MELANOMA EXCISION  may 2014   [facility] Dr. [place]   tooth removal  late 70's     Allergies  Allergen Reactions   Sulfamethoxazole-Trimethoprim Rash      Family History  Problem Relation Age of  Onset   Stroke Mother    Heart failure Father      Social History Jennifer Cochran reports that she has never smoked. She has never used smokeless tobacco. Jennifer Cochran reports no history of alcohol use.   Review of Systems CONSTITUTIONAL: No weight loss, fever, chills, weakness or fatigue.  HEENT: Eyes: No visual loss, blurred vision, double vision or yellow sclerae.No hearing loss, sneezing, congestion, runny nose or sore throat.  SKIN: No rash or itching.  CARDIOVASCULAR:  RESPIRATORY: No shortness of breath, cough or sputum.  GASTROINTESTINAL: No anorexia, nausea, vomiting or diarrhea. No abdominal pain or blood.  GENITOURINARY: No burning on urination, no polyuria NEUROLOGICAL: No headache, dizziness, syncope, paralysis, ataxia, numbness or tingling in the extremities. No change in bowel or bladder control.  MUSCULOSKELETAL: No muscle, back pain, joint pain or stiffness.  LYMPHATICS: No enlarged nodes. No history of splenectomy.  PSYCHIATRIC: No history of depression or anxiety.  ENDOCRINOLOGIC: No reports of sweating, cold or heat intolerance. No polyuria or polydipsia.  Jennifer Cochran   Physical Examination Vitals:   01/18/24 0815 01/18/24 0839  BP: (!) 158/84 132/84  Pulse: 80   SpO2:     Filed Weights   01/18/24 0811  Weight: 248 lb (112.5 kg)    Gen: resting comfortably, no acute distress HEENT: no scleral icterus, pupils equal round and reactive, no palptable cervical adenopathy,  CV: RRR, 3/6 systolic murmur rusb Resp: Clear to auscultation bilaterally GI: abdomen is soft, non-tender, non-distended, normal bowel sounds, no hepatosplenomegaly MSK: extremities are warm, no edema.  Skin: warm, no rash Neuro:  no focal deficits Psych: appropriate affect   Diagnostic Studies 08/2016 echo Study Conclusions   - Left ventricle: The cavity size was normal. Wall thickness was   increased in a pattern of mild LVH. Systolic function was   vigorous. The estimated ejection  fraction was in the range of 65%   to 70%. - Aortic valve: AV ismoderately thickened, calcified with mildly   restricted motion. Peak and mean gradients through the valve are   37 and 21 mm Hg respectively.   Jan 2021 echo IMPRESSIONS      1. Left ventricular ejection fraction, by visual estimation, is 65 to 70%. The left ventricle has hyperdynamic function. There is mildly increased left ventricular hypertrophy.  2. Elevated left ventricular end-diastolic pressure.  3. The left ventricle has no regional wall motion abnormalities.  4. Diastolic dysfunction, grade indeterminate.  5. Global right ventricle  has normal systolic function.The right ventricular size is normal. No increase in right ventricular wall thickness.  6. Left atrial size was normal.  7. Right atrial size was normal.  8. Mild mitral annular calcification.  9. The mitral valve is grossly normal. Trivial mitral valve regurgitation. 10. The tricuspid valve is not well visualized. 11. The aortic valve was not well visualized. Aortic valve regurgitation is mild. Moderate to severe aortic valve stenosis. 12. Dimensionless index suggestive of moderate aortic valve stenosis but peak velocities are approaching 4 m/sec. 13. The pulmonic valve was not well visualized. Pulmonic valve regurgitation is not visualized. 14. Normal pulmonary artery systolic pressure. 15. The interatrial septum was not well visualized.     02/2021 echo IMPRESSIONS     1. Left ventricular ejection fraction, by estimation, is 70 to 75%. The  left ventricle has hyperdynamic function. The left ventricle has no  regional wall motion abnormalities. There is mild left ventricular  hypertrophy. Left ventricular diastolic  parameters are consistent with Grade II diastolic dysfunction  (pseudonormalization). Elevated left ventricular end-diastolic pressure.   2. Right ventricular systolic function is normal. The right ventricular  size is normal. There is  normal pulmonary artery systolic pressure. The  estimated right ventricular systolic pressure is 31.5 mmHg.   3. The mitral valve is abnormal. Mild mitral valve regurgitation.  Moderate mitral annular calcification.   4. The aortic valve is tricuspid. There is severe calcifcation of the  aortic valve. Aortic valve regurgitation is trivial. Severe aortic valve  stenosis. Aortic valve area, by VTI measures 0.75 cm. Aortic valve Vmax  measures 4.98 m/s. Dimentionless  index 0.27.   5. The inferior vena cava is normal in size with greater than 50%  respiratory variability, suggesting right atrial pressure of 3 mmHg.      02/2021 GXT   up sloping ST depression in the inferolateral leads (II, III and aVF) was noted.   Prior study not available for comparison.   Negative adequate stress test without evidence of ischemia at given workload. Moderately impaired exercise tolerance.   08/2021 echo IMPRESSIONS     1. Left ventricular ejection fraction, by estimation, is 70 to 75%. The  left ventricle has hyperdynamic function. The left ventricle has no  regional wall motion abnormalities. There is moderate left ventricular  hypertrophy. Left ventricular diastolic  parameters are consistent with Grade II diastolic dysfunction  (pseudonormalization). Elevated left atrial pressure. The average left  ventricular global longitudinal strain is -18.5 %. The global longitudinal  strain is normal.   2. Right ventricular systolic function is normal. The right ventricular  size is normal. Tricuspid regurgitation signal is inadequate for assessing  PA pressure.   3. The mitral valve is normal in structure. Mild mitral valve  regurgitation. Mild mitral stenosis. Moderate mitral annular  calcification.   4. The aortic valve is tricuspid. There is moderate calcification of the  aortic valve. There is moderate thickening of the aortic valve. Aortic  valve regurgitation is not visualized. Severe aortic  valve stenosis.  Aortic valve mean gradient measures 51.5   mmHg. Aortic valve peak gradient measures 83.1 mmHg. Aortic valve area,  by VTI measures 0.74 cm.      10/2022 echo 1. Left ventricular ejection fraction, by estimation, is 65 to 70%. The  left ventricle has normal function. The left ventricle has no regional  wall motion abnormalities. There is mild left ventricular hypertrophy.  Left ventricular diastolic parameters  are indeterminate.   2. Right ventricular  systolic function is normal. The right ventricular  size is normal. Tricuspid regurgitation signal is inadequate for assessing  PA pressure.   3. The mitral valve is abnormal. Mild mitral valve regurgitation.  Moderate mitral stenosis. Moderate mitral annular calcification.   4. The tricuspid valve is abnormal.   5. The aortic valve is tricuspid. There is moderate calcification of the  aortic valve. There is moderate thickening of the aortic valve. Aortic  valve regurgitation is mild. Severe aortic valve stenosis. Aortic valve  mean gradient measures 61.3 mmHg.  Aortic valve peak gradient measures 98.0 mmHg. Aortic valve area, by VTI  measures 0.52 cm.   6. The inferior vena cava is normal in size with greater than 50%  respiratory variability, suggesting right atrial pressure of 3 mmHg.      04/2023 echo 1. Left ventricular ejection fraction, by estimation, is 70 to 75%. The  left ventricle has hyperdynamic function. The left ventricle has no  regional wall motion abnormalities. There is mild concentric left  ventricular hypertrophy. Left ventricular  diastolic parameters are indeterminate.   2. Right ventricular systolic function is normal. The right ventricular  size is normal. There is moderately elevated pulmonary artery systolic  pressure. The estimated right ventricular systolic pressure is 55.1 mmHg.   3. Left atrial size was upper normal.   4. The mitral valve is degenerative. Mild to moderate mitral  valve  regurgitation. Mild to moderate mitral stenosis. Mean MV gradient 3-6  mmHg. Moderate to severe mitral annular calcification.   5. The aortic valve is tricuspid. There is severe calcifcation of the  aortic valve. Aortic valve regurgitation is mild. Severe aortic valve  stenosis. Aortic valve mean gradient measures 58.8 mmHg. Aortic valve Vmax  measures 4.92 m/s. Dimentionless index   0.16.   6. The inferior vena cava is normal in size with <50% respiratory  variability, suggesting right atrial pressure of 8 mmHg.  10/2023 echo 1. Left ventricular ejection fraction, by estimation, is 60 to 65%. The  left ventricle has normal function. The left ventricle has no regional  wall motion abnormalities. There is mild concentric left ventricular  hypertrophy. Left ventricular diastolic  function could not be evaluated.   2. Right ventricular systolic function is normal. The right ventricular  size is normal. Tricuspid regurgitation signal is inadequate for assessing  PA pressure.   3. The mitral valve is abnormal. Mild mitral valve regurgitation. Atleast  mild mitral stenosis. Moderate mitral annular calcification.   4. The aortic valve has an indeterminant number of cusps. There is severe  calcifcation of the aortic valve. There is severe thickening of the aortic  valve. Aortic valve regurgitation is moderate. Severe aortic valve  stenosis. Aortic valve area, by VTI  measures 0.75 cm. Aortic valve mean gradient measures 61.0 mmHg. Aortic  valve Vmax measures 5.02 m/s. DVI is 0.22.   5. The inferior vena cava is normal in size with <50% respiratory  variability, suggesting right atrial pressure of 8 mmHg.   Assessment and Plan   Aortic stenosis -severe AS, has been asymptomatic - recent GXT showed exercise capacity is 100% of predicted based on gender and age - continue to monitor closely, echos every 6 months. Tight valve but has not had symptoms, has normal LVEF, and recently did  fine on GXT. Valve intervention has not been indicated as of yet  - remains asymptomatic - we will repeat echo in 04/2024, f/u in clinic after     2. PAF/acquired thormbophilia -  no symptoms, continue current meds includnig eliquis  for stroke prevention   3. Carotid stenosis - repeat US  next year, moderate bilateral stenosis   4. HTN - essentially at goal by repeat manual bp, home numbers overall at goal - continue current meds   5. Hyperlipidemia - her LDL is at goal, continue current meds    [place] Jennifer Cochran, M.D.

## 2024-01-18 NOTE — Patient Instructions (Signed)
 Medication Instructions:   Continue all current medications.   Labwork:  none  Testing/Procedures:  Your physician has requested that you have an echocardiogram. Echocardiography is a painless test that uses sound waves to create images of your heart. It provides your doctor with information about the size and shape of your heart and how well your heart's chambers and valves are working. This procedure takes approximately one hour. There are no restrictions for this procedure. Please do NOT wear cologne, perfume, aftershave, or lotions (deodorant is allowed). Please arrive 15 minutes prior to your appointment time.  Please note: We ask at that you not bring children with you during ultrasound (echo/ vascular) testing. Due to room size and safety concerns, children are not allowed in the ultrasound rooms during exams. Our front office staff cannot provide observation of children in our lobby area while testing is being conducted. An adult accompanying a patient to their appointment will only be allowed in the ultrasound room at the discretion of the ultrasound technician under special circumstances. We apologize for any inconvenience. DUE FEBRUARY 2026  Follow-Up:  After Echo February or March   Any Other Special Instructions Will Be Listed Below (If Applicable).   If you need a refill on your cardiac medications before your next appointment, please call your pharmacy.

## 2024-01-31 DIAGNOSIS — Z23 Encounter for immunization: Secondary | ICD-10-CM | POA: Diagnosis not present

## 2024-01-31 DIAGNOSIS — E1165 Type 2 diabetes mellitus with hyperglycemia: Secondary | ICD-10-CM | POA: Diagnosis not present

## 2024-02-09 DIAGNOSIS — E1165 Type 2 diabetes mellitus with hyperglycemia: Secondary | ICD-10-CM | POA: Diagnosis not present

## 2024-02-09 DIAGNOSIS — H40053 Ocular hypertension, bilateral: Secondary | ICD-10-CM | POA: Diagnosis not present

## 2024-02-09 DIAGNOSIS — I350 Nonrheumatic aortic (valve) stenosis: Secondary | ICD-10-CM | POA: Diagnosis not present

## 2024-02-09 DIAGNOSIS — I10 Essential (primary) hypertension: Secondary | ICD-10-CM | POA: Diagnosis not present

## 2024-02-09 DIAGNOSIS — I6522 Occlusion and stenosis of left carotid artery: Secondary | ICD-10-CM | POA: Diagnosis not present

## 2024-02-09 DIAGNOSIS — I4891 Unspecified atrial fibrillation: Secondary | ICD-10-CM | POA: Diagnosis not present

## 2024-02-09 DIAGNOSIS — E785 Hyperlipidemia, unspecified: Secondary | ICD-10-CM | POA: Diagnosis not present

## 2024-02-09 DIAGNOSIS — D696 Thrombocytopenia, unspecified: Secondary | ICD-10-CM | POA: Diagnosis not present

## 2024-02-09 DIAGNOSIS — Z0000 Encounter for general adult medical examination without abnormal findings: Secondary | ICD-10-CM | POA: Diagnosis not present

## 2024-03-13 ENCOUNTER — Other Ambulatory Visit: Payer: Self-pay | Admitting: Cardiology

## 2024-03-13 NOTE — Telephone Encounter (Signed)
 Pt last saw Dr Alvan 01/18/24, last labs 01/31/24 Creat 0.79 At Dr Darlyn Hurst per [facility] age 79, weight 112.5kg, based on specified criteria pt is on appropriate dosage of Eliquis  5mg  BID for afib.  Will refill rx.

## 2024-04-20 ENCOUNTER — Ambulatory Visit

## 2024-05-05 ENCOUNTER — Ambulatory Visit: Admitting: Cardiology
